# Patient Record
Sex: Female | Born: 1975
Health system: Southern US, Community
[De-identification: ages and names within clinical notes are randomized; demographics above are authoritative.]

## PROBLEM LIST (undated history)

## (undated) DIAGNOSIS — A630 Anogenital (venereal) warts: Secondary | ICD-10-CM

## (undated) DIAGNOSIS — R12 Heartburn: Secondary | ICD-10-CM

## (undated) HISTORY — DX: Anogenital (venereal) warts: A63.0

## (undated) HISTORY — PX: INTRAUTERINE DEVICE (IUD) INSERTION: SHX5877

## (undated) HISTORY — DX: Heartburn: R12

---

## 2005-03-13 DIAGNOSIS — A630 Anogenital (venereal) warts: Secondary | ICD-10-CM

## 2005-03-13 HISTORY — DX: Anogenital (venereal) warts: A63.0

## 2008-10-10 ENCOUNTER — Emergency Department: Payer: Self-pay | Admitting: Emergency Medicine

## 2009-04-25 ENCOUNTER — Inpatient Hospital Stay: Payer: Self-pay

## 2015-12-22 DIAGNOSIS — N898 Other specified noninflammatory disorders of vagina: Secondary | ICD-10-CM | POA: Diagnosis not present

## 2015-12-28 DIAGNOSIS — Z0000 Encounter for general adult medical examination without abnormal findings: Secondary | ICD-10-CM | POA: Diagnosis not present

## 2015-12-28 DIAGNOSIS — Z01419 Encounter for gynecological examination (general) (routine) without abnormal findings: Secondary | ICD-10-CM | POA: Diagnosis not present

## 2015-12-28 DIAGNOSIS — N898 Other specified noninflammatory disorders of vagina: Secondary | ICD-10-CM | POA: Diagnosis not present

## 2016-03-15 DIAGNOSIS — H5213 Myopia, bilateral: Secondary | ICD-10-CM | POA: Diagnosis not present

## 2016-03-15 DIAGNOSIS — H52223 Regular astigmatism, bilateral: Secondary | ICD-10-CM | POA: Diagnosis not present

## 2016-10-04 DIAGNOSIS — J069 Acute upper respiratory infection, unspecified: Secondary | ICD-10-CM | POA: Diagnosis not present

## 2017-05-07 DIAGNOSIS — H5213 Myopia, bilateral: Secondary | ICD-10-CM | POA: Diagnosis not present

## 2017-05-07 DIAGNOSIS — H52223 Regular astigmatism, bilateral: Secondary | ICD-10-CM | POA: Diagnosis not present

## 2017-05-28 ENCOUNTER — Encounter: Payer: Self-pay | Admitting: Obstetrics and Gynecology

## 2017-05-28 ENCOUNTER — Ambulatory Visit (INDEPENDENT_AMBULATORY_CARE_PROVIDER_SITE_OTHER): Payer: 59 | Admitting: Obstetrics and Gynecology

## 2017-05-28 VITALS — BP 102/66 | HR 83 | Ht 66.0 in | Wt 181.0 lb

## 2017-05-28 DIAGNOSIS — Z13220 Encounter for screening for lipoid disorders: Secondary | ICD-10-CM

## 2017-05-28 DIAGNOSIS — Z131 Encounter for screening for diabetes mellitus: Secondary | ICD-10-CM

## 2017-05-28 DIAGNOSIS — Z1151 Encounter for screening for human papillomavirus (HPV): Secondary | ICD-10-CM

## 2017-05-28 DIAGNOSIS — Z1321 Encounter for screening for nutritional disorder: Secondary | ICD-10-CM

## 2017-05-28 DIAGNOSIS — Z30431 Encounter for routine checking of intrauterine contraceptive device: Secondary | ICD-10-CM | POA: Diagnosis not present

## 2017-05-28 DIAGNOSIS — Z124 Encounter for screening for malignant neoplasm of cervix: Secondary | ICD-10-CM | POA: Diagnosis not present

## 2017-05-28 DIAGNOSIS — Z0000 Encounter for general adult medical examination without abnormal findings: Secondary | ICD-10-CM | POA: Diagnosis not present

## 2017-05-28 DIAGNOSIS — Z01419 Encounter for gynecological examination (general) (routine) without abnormal findings: Secondary | ICD-10-CM

## 2017-05-28 NOTE — Patient Instructions (Signed)
I value your feedback and entrusting us with your care. If you get a [facility] patient survey, I would appreciate you taking the time to let us know about your experience today. Thank you! 

## 2017-05-28 NOTE — Addendum Note (Signed)
Addended by: Althea GrimmerOPLAND, Holmes Hays B on: 05/28/2017 03:24 PM   Modules accepted: Orders

## 2017-05-28 NOTE — Progress Notes (Signed)
PCP:  Patient, No Pcp Per   Chief Complaint  Patient presents with  . Gynecologic Exam    HPI:      Carla Johnson is a 42 y.o. No obstetric history on file. who LMP was No LMP recorded. Patient is not currently having periods (Reason: IUD)., presents today for her annual examination.  Her menses are absent with IUD, although she has about 2-3 days light bleeding twice a yr. Dysmenorrhea mild, occurring throughout cycle. She does not have intermenstrual bleeding.  Sex activity: single partner, contraception - IUD. Mirena placed 06/12/14 Last Pap: Jul 15, 2013  Results were: no abnormalities /neg HPV DNA  Hx of STDs: HPV  There is no FH of breast cancer. There is no FH of ovarian cancer. The patient does do self-breast exams.  Tobacco use: The patient denies current or previous tobacco use. Alcohol use: social drinker No drug use.  Exercise: moderately active  She does get adequate calcium but not Vitamin D in her diet.   Past Medical History:  Diagnosis Date  . Genital warts     Past Surgical History:  Procedure Laterality Date  . INTRAUTERINE DEVICE (IUD) INSERTION      Family History  Problem Relation Age of Onset  . Kidney cancer Maternal Grandfather [id number]    Social History   Socioeconomic History  . Marital status: Married    Spouse name: Not on file  . Number of children: Not on file  . Years of education: Not on file  . Highest education level: Not on file  Social Needs  . Financial resource strain: Not on file  . Food insecurity - worry: Not on file  . Food insecurity - inability: Not on file  . Transportation needs - medical: Not on file  . Transportation needs - non-medical: Not on file  Occupational History  . Not on file  Tobacco Use  . Smoking status: Never Smoker  . Smokeless tobacco: Never Used  Substance and Sexual Activity  . Alcohol use: Yes  . Drug use: No  . Sexual activity: Yes    Birth control/protection: IUD  Other Topics Concern    . Not on file  Social History Narrative  . Not on file    Outpatient Medications Prior to Visit  Medication Sig Dispense Refill  . levonorgestrel (MIRENA, 52 MG,) 20 MCG/24HR IUD by Intrauterine route.      No facility-administered medications prior to visit.       ROS:  Review of Systems  Constitutional: Negative for fatigue, fever and unexpected weight change.  Respiratory: Negative for cough, shortness of breath and wheezing.   Cardiovascular: Negative for chest pain, palpitations and leg swelling.  Gastrointestinal: Negative for blood in stool, constipation, diarrhea, nausea and vomiting.  Endocrine: Negative for cold intolerance, heat intolerance and polyuria.  Genitourinary: Negative for dyspareunia, dysuria, flank pain, frequency, genital sores, hematuria, menstrual problem, pelvic pain, urgency, vaginal bleeding, vaginal discharge and vaginal pain.  Musculoskeletal: Negative for back pain, joint swelling and myalgias.  Skin: Negative for rash.  Neurological: Negative for dizziness, syncope, light-headedness, numbness and headaches.  Hematological: Negative for adenopathy.  Psychiatric/Behavioral: Negative for agitation, confusion, sleep disturbance and suicidal ideas. The patient is not nervous/anxious.   BREAST: No symptoms   Objective: BP 102/66   Pulse 83   Ht 5\' 6"  (1.676 m)   Wt 181 lb (82.1 kg)   BMI 29.21 kg/m    Physical Exam  Constitutional: She is oriented to  person, place, and time. She appears well-developed and well-nourished.  Genitourinary: Vagina normal and uterus normal. There is no rash or tenderness on the right labia. There is no rash or tenderness on the left labia. No erythema or tenderness in the vagina. No vaginal discharge found. Right adnexum does not display mass and does not display tenderness. Left adnexum does not display mass and does not display tenderness.  Cervix exhibits visible IUD strings. Cervix does not exhibit motion  tenderness or polyp. Uterus is not enlarged or tender.  Neck: Normal range of motion. No thyromegaly present.  Cardiovascular: Normal rate, regular rhythm and normal heart sounds.  No murmur heard. Pulmonary/Chest: Effort normal and breath sounds normal. Right breast exhibits no mass, no nipple discharge, no skin change and no tenderness. Left breast exhibits no mass, no nipple discharge, no skin change and no tenderness.  Abdominal: Soft. There is no tenderness. There is no guarding.  Musculoskeletal: Normal range of motion.  Neurological: She is alert and oriented to person, place, and time. No cranial nerve deficit.  Psychiatric: She has a normal mood and affect. Her behavior is normal.  Vitals reviewed.   Assessment/Plan: Encounter for annual routine gynecological examination  Cervical cancer screening - Plan: IGP, Aptima HPV  Screening for HPV (human papillomavirus) - Plan: IGP, Aptima HPV  Encounter for routine checking of intrauterine contraceptive device (IUD) - IUD in place. Due for rem 4/21.  Blood tests for routine general physical examination - Plan: Comprehensive metabolic panel, Lipid panel, Hemoglobin A1c, VITAMIN D 25 Hydroxy (Vit-D Deficiency, Fractures)  Screening cholesterol level - Plan: Lipid panel  Screening for diabetes mellitus - Plan: Hemoglobin A1c  Encounter for vitamin deficiency screening - Plan: VITAMIN D 25 Hydroxy (Vit-D Deficiency, Fractures)      GYN counsel adequate intake of calcium and vitamin D, diet and exercise     F/U  Return in about 1 year (around 05/29/2018).  Doshie Maggi B. Lanett Lasorsa, PA-C 05/28/2017 9:02 AM

## 2017-05-30 LAB — IGP, APTIMA HPV
HPV APTIMA: NEGATIVE
PAP Smear Comment: 0

## 2017-05-31 ENCOUNTER — Other Ambulatory Visit: Payer: 59

## 2017-05-31 DIAGNOSIS — Z1321 Encounter for screening for nutritional disorder: Secondary | ICD-10-CM | POA: Diagnosis not present

## 2017-05-31 DIAGNOSIS — Z131 Encounter for screening for diabetes mellitus: Secondary | ICD-10-CM | POA: Diagnosis not present

## 2017-05-31 DIAGNOSIS — Z0000 Encounter for general adult medical examination without abnormal findings: Secondary | ICD-10-CM

## 2017-05-31 DIAGNOSIS — Z13220 Encounter for screening for lipoid disorders: Secondary | ICD-10-CM

## 2017-06-01 LAB — COMPREHENSIVE METABOLIC PANEL
ALBUMIN: 4.5 g/dL (ref 3.5–5.5)
ALK PHOS: 56 IU/L (ref 39–117)
ALT: 17 IU/L (ref 0–32)
AST: 14 IU/L (ref 0–40)
Albumin/Globulin Ratio: 2.3 — ABNORMAL HIGH (ref 1.2–2.2)
BILIRUBIN TOTAL: 0.4 mg/dL (ref 0.0–1.2)
BUN / CREAT RATIO: 15 (ref 9–23)
BUN: 9 mg/dL (ref 6–20)
CO2: 21 mmol/L (ref 20–29)
CREATININE: 0.62 mg/dL (ref 0.57–1.00)
Calcium: 8.7 mg/dL (ref 8.7–10.2)
Chloride: 107 mmol/L — ABNORMAL HIGH (ref 96–106)
GFR calc non Af Amer: 114 mL/min/{1.73_m2} (ref >59)
GFR, EST AFRICAN AMERICAN: 131 mL/min/{1.73_m2} (ref >59)
GLOBULIN, TOTAL: 2 g/dL (ref 1.5–4.5)
Glucose: 80 mg/dL (ref 65–99)
Potassium: 4.1 mmol/L (ref 3.5–5.2)
SODIUM: 142 mmol/L (ref 134–144)
Total Protein: 6.5 g/dL (ref 6.0–8.5)

## 2017-06-01 LAB — HEMOGLOBIN A1C
Est. average glucose Bld gHb Est-mCnc: 91 mg/dL
Hgb A1c MFr Bld: 4.8 % (ref 4.8–5.6)

## 2017-06-01 LAB — LIPID PANEL
CHOLESTEROL TOTAL: 121 mg/dL (ref 100–199)
Chol/HDL Ratio: 3.1 ratio (ref 0.0–4.4)
HDL: 39 mg/dL — AB (ref >39)
LDL Calculated: 65 mg/dL (ref 0–99)
Triglycerides: 86 mg/dL (ref 0–149)
VLDL CHOLESTEROL CAL: 17 mg/dL (ref 5–40)

## 2017-06-01 LAB — VITAMIN D 25 HYDROXY (VIT D DEFICIENCY, FRACTURES): VIT D 25 HYDROXY: 20.3 ng/mL — AB (ref 30.0–100.0)

## 2017-07-10 DIAGNOSIS — H16102 Unspecified superficial keratitis, left eye: Secondary | ICD-10-CM | POA: Diagnosis not present

## 2017-07-10 DIAGNOSIS — H16002 Unspecified corneal ulcer, left eye: Secondary | ICD-10-CM | POA: Diagnosis not present

## 2017-07-13 DIAGNOSIS — H16042 Marginal corneal ulcer, left eye: Secondary | ICD-10-CM | POA: Diagnosis not present

## 2018-05-15 DIAGNOSIS — H52223 Regular astigmatism, bilateral: Secondary | ICD-10-CM | POA: Diagnosis not present

## 2018-05-15 DIAGNOSIS — H5213 Myopia, bilateral: Secondary | ICD-10-CM | POA: Diagnosis not present

## 2018-12-31 DIAGNOSIS — H16141 Punctate keratitis, right eye: Secondary | ICD-10-CM | POA: Diagnosis not present

## 2019-10-22 DIAGNOSIS — H52223 Regular astigmatism, bilateral: Secondary | ICD-10-CM | POA: Diagnosis not present

## 2019-10-22 DIAGNOSIS — H5213 Myopia, bilateral: Secondary | ICD-10-CM | POA: Diagnosis not present

## 2020-02-06 DIAGNOSIS — B9689 Other specified bacterial agents as the cause of diseases classified elsewhere: Secondary | ICD-10-CM | POA: Diagnosis not present

## 2020-02-06 DIAGNOSIS — J209 Acute bronchitis, unspecified: Secondary | ICD-10-CM | POA: Diagnosis not present

## 2020-02-06 DIAGNOSIS — J019 Acute sinusitis, unspecified: Secondary | ICD-10-CM | POA: Diagnosis not present

## 2020-02-06 DIAGNOSIS — U071 COVID-19: Secondary | ICD-10-CM | POA: Diagnosis not present

## 2020-08-12 NOTE — Progress Notes (Signed)
PCP:  Patient, No Pcp Per (Inactive)   Chief Complaint  Patient presents with  . Gynecologic Exam    No concerns    HPI:      Ms. Carla Johnson is a 45 y.o. No obstetric history on file. who LMP was No LMP recorded. (Menstrual status: IUD)., presents today for her NP> 3 yrs annual examination.  Her menses are absent with IUD, although she has about 2-3 days light bleeding once a yr. Dysmenorrhea none. She does not have intermenstrual bleeding.  Sex activity: single partner, contraception - IUD. Mirena placed 06/12/14. May want another one next yr. Last Pap: 05/28/17  Results were: no abnormalities /neg HPV DNA  Hx of STDs: HPV  Mammogram: never There is no FH of breast cancer. There is no FH of ovarian cancer. The patient does do self-breast exams. Has LT breast tenderness at times that hurts to palpate. Unsure if related to cycle since amenorrheic. Drinks lots of caffeine. Concerned about chest pain due to FH CVD/MI in her dad.   Tobacco use: The patient denies current or previous tobacco use. Alcohol use: social drinker No drug use.  Exercise: not active  She does get adequate calcium and some Vitamin D in her diet. Hx of Vit D deficiency.  Due for fasting labs. FH CVD/MI in her dad age 49  Pt with long hx of anxiety, sometimes depression. Did lexapro for a couple yrs in her 25s but has been able to cope/due relaxation strategies since. Is having more anxiety/worry recently. Has increased job and life stress. Thinks she needs to restart meds.    Past Medical History:  Diagnosis Date  . Genital warts     Past Surgical History:  Procedure Laterality Date  . INTRAUTERINE DEVICE (IUD) INSERTION      Family History  Problem Relation Age of Onset  . Kidney cancer Maternal Grandfather 31  . Crohn's disease Father   . Heart attack Father 28  . Coronary artery disease Father 76    Social History   Socioeconomic History  . Marital status: Married    Spouse name: Not on  file  . Number of children: Not on file  . Years of education: Not on file  . Highest education level: Not on file  Occupational History  . Not on file  Tobacco Use  . Smoking status: Never Smoker  . Smokeless tobacco: Never Used  Vaping Use  . Vaping Use: Never used  Substance and Sexual Activity  . Alcohol use: Yes  . Drug use: No  . Sexual activity: Yes    Birth control/protection: I.U.D.    Comment: Mirena  Other Topics Concern  . Not on file  Social History Narrative  . Not on file   Social Determinants of Health   Financial Resource Strain: Not on file  Food Insecurity: Not on file  Transportation Needs: Not on file  Physical Activity: Not on file  Stress: Not on file  Social Connections: Not on file  Intimate Partner Violence: Not on file    Outpatient Medications Prior to Visit  Medication Sig Dispense Refill  . levonorgestrel (MIRENA) 20 MCG/24HR IUD by Intrauterine route.      No facility-administered medications prior to visit.      ROS:  Review of Systems  Constitutional: Negative for fatigue, fever and unexpected weight change.  Respiratory: Negative for cough, shortness of breath and wheezing.   Cardiovascular: Positive for chest pain. Negative for palpitations and leg swelling.  Gastrointestinal: Negative for blood in stool, constipation, diarrhea, nausea and vomiting.  Endocrine: Negative for cold intolerance, heat intolerance and polyuria.  Genitourinary: Negative for dyspareunia, dysuria, flank pain, frequency, genital sores, hematuria, menstrual problem, pelvic pain, urgency, vaginal bleeding, vaginal discharge and vaginal pain.  Musculoskeletal: Negative for back pain, joint swelling and myalgias.  Skin: Negative for rash.  Neurological: Negative for dizziness, syncope, light-headedness, numbness and headaches.  Hematological: Negative for adenopathy.  Psychiatric/Behavioral: Positive for agitation. Negative for confusion, sleep disturbance  and suicidal ideas. The patient is not nervous/anxious.   BREAST: No symptoms   Objective: BP 110/80   Ht 5\' 6"  (1.676 m)   Wt 191 lb (86.6 kg)   BMI 30.83 kg/m    Physical Exam Constitutional:      General: She is not in acute distress.    Appearance: She is well-developed.  Genitourinary:     Vulva normal.     Right Labia: No rash, tenderness or lesions.    Left Labia: No tenderness, lesions or rash.    No vaginal discharge, erythema, tenderness or bleeding.      Right Adnexa: not tender and no mass present.    Left Adnexa: not tender and no mass present.    No cervical motion tenderness, friability or polyp.     IUD strings visualized.     Uterus is not enlarged or tender.  Breasts:     Right: No mass, nipple discharge, skin change or tenderness.     Left: No mass, nipple discharge, skin change or tenderness.    Neck:     Thyroid: No thyromegaly.  Cardiovascular:     Rate and Rhythm: Normal rate and regular rhythm.     Heart sounds: Normal heart sounds. No murmur heard.   Pulmonary:     Effort: Pulmonary effort is normal.     Breath sounds: Normal breath sounds.  Abdominal:     Palpations: Abdomen is soft.     Tenderness: There is no abdominal tenderness. There is no guarding or rebound.  Musculoskeletal:        General: Normal range of motion.     Cervical back: Normal range of motion.  Lymphadenopathy:     Cervical: No cervical adenopathy.  Neurological:     General: No focal deficit present.     Mental Status: She is alert and oriented to person, place, and time.     Cranial Nerves: No cranial nerve deficit.  Skin:    General: Skin is warm and dry.  Psychiatric:        Mood and Affect: Mood normal.        Behavior: Behavior normal.        Thought Content: Thought content normal.        Judgment: Judgment normal.  Vitals and nursing note reviewed.    GAD 7 : Generalized Anxiety Score 08/13/2020  Nervous, Anxious, on Edge 2  Control/stop worrying 2   Worry too much - different things 2  Trouble relaxing 2  Restless 1  Easily annoyed or irritable 3  Afraid - awful might happen 1  Total GAD 7 Score 13  Anxiety Difficulty Very difficult    Depression screen PHQ 2/9 08/13/2020  Decreased Interest 1  Down, Depressed, Hopeless 1  PHQ - 2 Score 2  Altered sleeping 1  Tired, decreased energy 1  Change in appetite 3  Feeling bad or failure about yourself  2  Trouble concentrating 2  Moving slowly or fidgety/restless 1  Suicidal thoughts 0  PHQ-9 Score 12  Difficult doing work/chores Very difficult    Assessment/Plan: Encounter for annual routine gynecological examination  Encounter for routine checking of intrauterine contraceptive device (IUD)--IUD strings in cx os. Due for removal 4/23. Pt to decide if wants replacement.  Encounter for screening mammogram for malignant neoplasm of breast - Plan: MM 3D SCREEN BREAST BILATERAL; pt to sched mammo  Anxiety and depression - Plan: escitalopram (LEXAPRO) 10 MG tablet; Discussed SSRIs and benefits. Rx lexapro. F/u in 7 wks/sooner prn. Cont stress mgmt/coping.  Blood tests for routine general physical examination - Plan: Comprehensive metabolic panel, Lipid panel, VITAMIN D 25 Hydroxy (Vit-D Deficiency, Fractures)  Screening cholesterol level - Plan: Lipid panel  Encounter for vitamin deficiency screening - Plan: VITAMIN D 25 Hydroxy (Vit-D Deficiency, Fractures)  Vitamin D deficiency - Plan: VITAMIN D 25 Hydroxy (Vit-D Deficiency, Fractures)  Meds ordered this encounter  Medications  . escitalopram (LEXAPRO) 10 MG tablet    Sig: Take 1/2 tab daily for 6 days then 1 tab daily    Dispense:  30 tablet    Refill:  1    Order Specific Question:   Supervising Provider    Answer:   Nadara Mustard [[id number]]        GYN counsel adequate intake of calcium and vitamin D, diet and exercise     F/U  Return in about 7 weeks (around 10/01/2020) for anxiety f/u./ 1 yr annual  Chelbie Jarnagin B.  Oleva Koo, PA-C 08/13/2020 9:51 AM

## 2020-08-12 NOTE — Patient Instructions (Addendum)
I value your feedback and you entrusting us with your care. If you get a [facility] patient survey, I would appreciate you taking the time to let us know about your experience today. Thank you!  [facility]: [phone]      

## 2020-08-13 ENCOUNTER — Ambulatory Visit (INDEPENDENT_AMBULATORY_CARE_PROVIDER_SITE_OTHER): Payer: 59 | Admitting: Obstetrics and Gynecology

## 2020-08-13 ENCOUNTER — Encounter: Payer: Self-pay | Admitting: Obstetrics and Gynecology

## 2020-08-13 ENCOUNTER — Other Ambulatory Visit: Payer: Self-pay

## 2020-08-13 VITALS — BP 110/80 | Ht 66.0 in | Wt 191.0 lb

## 2020-08-13 DIAGNOSIS — Z1321 Encounter for screening for nutritional disorder: Secondary | ICD-10-CM | POA: Diagnosis not present

## 2020-08-13 DIAGNOSIS — Z30431 Encounter for routine checking of intrauterine contraceptive device: Secondary | ICD-10-CM

## 2020-08-13 DIAGNOSIS — F419 Anxiety disorder, unspecified: Secondary | ICD-10-CM | POA: Diagnosis not present

## 2020-08-13 DIAGNOSIS — E559 Vitamin D deficiency, unspecified: Secondary | ICD-10-CM | POA: Insufficient documentation

## 2020-08-13 DIAGNOSIS — Z0000 Encounter for general adult medical examination without abnormal findings: Secondary | ICD-10-CM

## 2020-08-13 DIAGNOSIS — Z1231 Encounter for screening mammogram for malignant neoplasm of breast: Secondary | ICD-10-CM | POA: Diagnosis not present

## 2020-08-13 DIAGNOSIS — Z01419 Encounter for gynecological examination (general) (routine) without abnormal findings: Secondary | ICD-10-CM | POA: Diagnosis not present

## 2020-08-13 DIAGNOSIS — Z13220 Encounter for screening for lipoid disorders: Secondary | ICD-10-CM

## 2020-08-13 DIAGNOSIS — F32A Depression, unspecified: Secondary | ICD-10-CM

## 2020-08-13 MED ORDER — ESCITALOPRAM OXALATE 10 MG PO TABS: ORAL_TABLET | ORAL | 1 refills | Status: DC

## 2020-08-14 LAB — LIPID PANEL
Chol/HDL Ratio: 3.2 ratio (ref 0.0–4.4)
Cholesterol, Total: 181 mg/dL (ref 100–199)
HDL: 56 mg/dL (ref >39)
LDL Chol Calc (NIH): 101 mg/dL — ABNORMAL HIGH (ref 0–99)
Triglycerides: 140 mg/dL (ref 0–149)
VLDL Cholesterol Cal: 24 mg/dL (ref 5–40)

## 2020-08-14 LAB — COMPREHENSIVE METABOLIC PANEL
ALT: 49 IU/L — ABNORMAL HIGH (ref 0–32)
AST: 24 IU/L (ref 0–40)
Albumin/Globulin Ratio: 2 (ref 1.2–2.2)
Albumin: 4.8 g/dL (ref 3.8–4.8)
Alkaline Phosphatase: 66 IU/L (ref 44–121)
BUN/Creatinine Ratio: 15 (ref 9–23)
BUN: 10 mg/dL (ref 6–24)
Bilirubin Total: 0.4 mg/dL (ref 0.0–1.2)
CO2: 21 mmol/L (ref 20–29)
Calcium: 9.6 mg/dL (ref 8.7–10.2)
Chloride: 103 mmol/L (ref 96–106)
Creatinine, Ser: 0.66 mg/dL (ref 0.57–1.00)
Globulin, Total: 2.4 g/dL (ref 1.5–4.5)
Glucose: 91 mg/dL (ref 65–99)
Potassium: 4.3 mmol/L (ref 3.5–5.2)
Sodium: 140 mmol/L (ref 134–144)
Total Protein: 7.2 g/dL (ref 6.0–8.5)
eGFR: 112 mL/min/{1.73_m2} (ref >59)

## 2020-08-14 LAB — VITAMIN D 25 HYDROXY (VIT D DEFICIENCY, FRACTURES): Vit D, 25-Hydroxy: 39 ng/mL (ref 30.0–100.0)

## 2020-10-01 NOTE — Progress Notes (Signed)
Patient, No Pcp Per (Inactive)   Chief Complaint  Patient presents with   Follow-up    Anxiety f/u    HPI:      Ms. Carla Johnson is a 45 y.o. G2P1011 whose LMP was No LMP recorded. (Menstrual status: IUD)., presents today for anxiety f/u from 6/22 annual. Restarted lexapro 10 mg, doing well. No side effects. No change in job stress but handling it better. Would like to cont meds. PHQ and GAD scores much improved.   08/13/20 Pt with long hx of anxiety, sometimes depression. Did lexapro for a couple yrs in her 42s but has been able to cope/due relaxation strategies since. Is having more anxiety/worry recently. Has increased job and life stress. Thinks she needs to restart meds.   Past Medical History:  Diagnosis Date   Genital warts     Past Surgical History:  Procedure Laterality Date   INTRAUTERINE DEVICE (IUD) INSERTION      Family History  Problem Relation Age of Onset   Kidney cancer Maternal Grandfather 67   Crohn's disease Father    Heart attack Father 65   Coronary artery disease Father 84    Social History   Socioeconomic History   Marital status: Married    Spouse name: Not on file   Number of children: Not on file   Years of education: Not on file   Highest education level: Not on file  Occupational History   Not on file  Tobacco Use   Smoking status: Never   Smokeless tobacco: Never  Vaping Use   Vaping Use: Never used  Substance and Sexual Activity   Alcohol use: Yes   Drug use: No   Sexual activity: Yes    Birth control/protection: I.U.D.    Comment: Mirena  Other Topics Concern   Not on file  Social History Narrative   Not on file   Social Determinants of Health   Financial Resource Strain: Not on file  Food Insecurity: Not on file  Transportation Needs: Not on file  Physical Activity: Not on file  Stress: Not on file  Social Connections: Not on file  Intimate Partner Violence: Not on file    Outpatient Medications Prior to Visit   Medication Sig Dispense Refill   levonorgestrel (MIRENA) 20 MCG/24HR IUD by Intrauterine route.      escitalopram (LEXAPRO) 10 MG tablet Take 1/2 tab daily for 6 days then 1 tab daily 30 tablet 1   No facility-administered medications prior to visit.      ROS:  Review of Systems  Constitutional:  Negative for fever.  Gastrointestinal:  Negative for blood in stool, constipation, diarrhea, nausea and vomiting.  Genitourinary:  Negative for dyspareunia, dysuria, flank pain, frequency, hematuria, urgency, vaginal bleeding, vaginal discharge and vaginal pain.  Musculoskeletal:  Negative for back pain.  Skin:  Negative for rash.  Psychiatric/Behavioral:  Negative for agitation and dysphoric mood.   BREAST: No symptoms   OBJECTIVE:   Vitals:  BP 130/80   Ht 5\' 6"  (1.676 m)   Wt 190 lb (86.2 kg)   BMI 30.67 kg/m   Physical Exam Constitutional:      Appearance: Normal appearance.  Pulmonary:     Effort: Pulmonary effort is normal.  Musculoskeletal:        General: Normal range of motion.  Neurological:     Mental Status: She is alert and oriented to person, place, and time.  Psychiatric:  Judgment: Judgment normal.    Results: GAD 7 : Generalized Anxiety Score 10/04/2020 08/13/2020  Nervous, Anxious, on Edge 0 2  Control/stop worrying 0 2  Worry too much - different things 0 2  Trouble relaxing 0 2  Restless 0 1  Easily annoyed or irritable 1 3  Afraid - awful might happen 0 1  Total GAD 7 Score 1 13  Anxiety Difficulty Not difficult at all Very difficult    Depression screen [facility] 2/9 10/04/2020  Decreased Interest 0  Down, Depressed, Hopeless 1  PHQ - 2 Score 1  Altered sleeping 1  Tired, decreased energy 0  Change in appetite 0  Feeling bad or failure about yourself  0  Trouble concentrating 0  Moving slowly or fidgety/restless 0  Suicidal thoughts 0  PHQ-9 Score 2  Difficult doing work/chores Somewhat difficult      Assessment/Plan: Anxiety and  depression - Plan: escitalopram (LEXAPRO) 10 MG tablet; doing well. Rx RF. F/u prn   Meds ordered this encounter  Medications   escitalopram (LEXAPRO) 10 MG tablet    Sig: Take 1 tablet (10 mg total) by mouth daily.    Dispense:  90 tablet    Refill:  3    Order Specific Question:   Supervising Provider    Answer:   Nadara Mustard [[id number]]       Return if symptoms worsen or fail to improve.  Aylan Bayona B. Karry Causer, PA-C 10/04/2020 9:28 AM

## 2020-10-04 ENCOUNTER — Other Ambulatory Visit: Payer: Self-pay

## 2020-10-04 ENCOUNTER — Encounter: Payer: Self-pay | Admitting: Obstetrics and Gynecology

## 2020-10-04 ENCOUNTER — Ambulatory Visit (INDEPENDENT_AMBULATORY_CARE_PROVIDER_SITE_OTHER): Payer: 59 | Admitting: Obstetrics and Gynecology

## 2020-10-04 VITALS — BP 130/80 | Ht 66.0 in | Wt 190.0 lb

## 2020-10-04 DIAGNOSIS — F32A Depression, unspecified: Secondary | ICD-10-CM | POA: Diagnosis not present

## 2020-10-04 DIAGNOSIS — F419 Anxiety disorder, unspecified: Secondary | ICD-10-CM | POA: Diagnosis not present

## 2020-10-04 MED ORDER — ESCITALOPRAM OXALATE 10 MG PO TABS
10.0000 mg | ORAL_TABLET | Freq: Every day | ORAL | 3 refills | Status: DC
  Filled 2020-10-04: qty 90, 90d supply, fill #0
  Filled 2021-02-21: qty 90, 90d supply, fill #1
  Filled 2021-07-14: qty 90, 90d supply, fill #2
  Filled 2021-10-01: qty 90, 90d supply, fill #3

## 2020-12-16 DIAGNOSIS — H5213 Myopia, bilateral: Secondary | ICD-10-CM | POA: Diagnosis not present

## 2020-12-17 DIAGNOSIS — H348122 Central retinal vein occlusion, left eye, stable: Secondary | ICD-10-CM | POA: Diagnosis not present

## 2020-12-24 ENCOUNTER — Other Ambulatory Visit: Payer: Self-pay

## 2020-12-24 DIAGNOSIS — R0789 Other chest pain: Secondary | ICD-10-CM | POA: Diagnosis not present

## 2020-12-24 DIAGNOSIS — Z130 Encounter for screening for diseases of the blood and blood-forming organs and certain disorders involving the immune mechanism: Secondary | ICD-10-CM | POA: Diagnosis not present

## 2020-12-24 DIAGNOSIS — F419 Anxiety disorder, unspecified: Secondary | ICD-10-CM | POA: Diagnosis not present

## 2020-12-24 DIAGNOSIS — H3509 Other intraretinal microvascular abnormalities: Secondary | ICD-10-CM | POA: Diagnosis not present

## 2020-12-24 MED ORDER — PROPRANOLOL HCL 10 MG PO TABS
ORAL_TABLET | ORAL | 2 refills | Status: AC
  Filled 2020-12-24: qty 30, 30d supply, fill #0

## 2020-12-29 ENCOUNTER — Ambulatory Visit
Admission: RE | Admit: 2020-12-29 | Discharge: 2020-12-29 | Disposition: A | Discharge status: Home / Self Care | Payer: 59 | Source: Ambulatory Visit | Attending: Obstetrics and Gynecology | Admitting: Obstetrics and Gynecology

## 2020-12-29 ENCOUNTER — Other Ambulatory Visit: Payer: Self-pay

## 2020-12-29 DIAGNOSIS — Z1231 Encounter for screening mammogram for malignant neoplasm of breast: Secondary | ICD-10-CM | POA: Insufficient documentation

## 2020-12-31 DIAGNOSIS — Z131 Encounter for screening for diabetes mellitus: Secondary | ICD-10-CM | POA: Diagnosis not present

## 2020-12-31 DIAGNOSIS — Z13220 Encounter for screening for lipoid disorders: Secondary | ICD-10-CM | POA: Diagnosis not present

## 2021-01-11 DIAGNOSIS — H348122 Central retinal vein occlusion, left eye, stable: Secondary | ICD-10-CM

## 2021-01-11 HISTORY — DX: Central retinal vein occlusion, left eye, stable: H34.8122

## 2021-01-13 ENCOUNTER — Inpatient Hospital Stay: Payer: 59

## 2021-01-13 ENCOUNTER — Inpatient Hospital Stay: Payer: 59 | Attending: Oncology | Admitting: Oncology

## 2021-01-13 ENCOUNTER — Other Ambulatory Visit: Payer: Self-pay

## 2021-01-13 ENCOUNTER — Encounter: Payer: Self-pay | Admitting: Oncology

## 2021-01-13 VITALS — BP 119/84 | HR 70 | Temp 98.1°F | Resp 18 | Wt 195.9 lb

## 2021-01-13 DIAGNOSIS — Z79899 Other long term (current) drug therapy: Secondary | ICD-10-CM | POA: Insufficient documentation

## 2021-01-13 DIAGNOSIS — H348122 Central retinal vein occlusion, left eye, stable: Secondary | ICD-10-CM | POA: Insufficient documentation

## 2021-01-13 DIAGNOSIS — Z8051 Family history of malignant neoplasm of kidney: Secondary | ICD-10-CM | POA: Diagnosis not present

## 2021-01-13 DIAGNOSIS — Z793 Long term (current) use of hormonal contraceptives: Secondary | ICD-10-CM | POA: Insufficient documentation

## 2021-01-13 DIAGNOSIS — Z8249 Family history of ischemic heart disease and other diseases of the circulatory system: Secondary | ICD-10-CM | POA: Diagnosis not present

## 2021-01-13 DIAGNOSIS — Z8379 Family history of other diseases of the digestive system: Secondary | ICD-10-CM | POA: Insufficient documentation

## 2021-01-13 LAB — ANTITHROMBIN III: AntiThromb III Func: 106 % (ref 75–120)

## 2021-01-13 NOTE — Progress Notes (Signed)
Hematology/Oncology Consult note [facility] [phone] [phone]   Patient Care Team: Cyndia Diver, PA-C as PCP - General (Family Medicine)  REFERRING PROVIDER: Cyndia Diver, PA-C  CHIEF COMPLAINTS/REASON FOR VISIT:  Evaluation of left retinal vein occlusion  HISTORY OF PRESENTING ILLNESS:   Carla Johnson is a  45 y.o.  female with PMH listed below was seen in consultation at the request of  Cyndia Diver, PA-C  for evaluation of left eye retinal vein occlusion Patient was recently referred by her optometrist to a retina specialist due to crowding behind left eye.  Patient denies any significant vision changes or concerns.  After being told that patient has retinal vein occlusion, patient has then noticed some " thickness" sensation.  Patient was recommended by her ophthalmology specialist to have hypercoagulable work-up. With her primary care provider patient has had blood work done which showed a normal A1c and normal cholesterol level. Patient is otherwise healthy.  Patient is on Lexapro She also is on Mirena IUD. Patient denies any personal history of blood clots or family history of blood clots. Family history positive for maternal grandfather with kidney cancer.  No other cancer in the family. She denies any unintentional weight loss, night sweats, fever.  Review of Systems  Constitutional:  Negative for appetite change, chills, fatigue and fever.  HENT:   Negative for hearing loss and voice change.   Eyes:  Positive for eye problems.  Respiratory:  Negative for chest tightness and cough.   Cardiovascular:  Negative for chest pain.  Gastrointestinal:  Negative for abdominal distention, abdominal pain and blood in stool.  Endocrine: Negative for hot flashes.  Genitourinary:  Negative for difficulty urinating and frequency.   Musculoskeletal:  Negative for arthralgias.  Skin:  Negative for itching and rash.   Neurological:  Negative for extremity weakness.  Hematological:  Negative for adenopathy.  Psychiatric/Behavioral:  Negative for confusion.    MEDICAL HISTORY:  Past Medical History:  Diagnosis Date   Genital warts 2007   Heartburn     SURGICAL HISTORY: Past Surgical History:  Procedure Laterality Date   INTRAUTERINE DEVICE (IUD) INSERTION      SOCIAL HISTORY: Social History   Socioeconomic History   Marital status: Married    Spouse name: Not on file   Number of children: Not on file   Years of education: Not on file   Highest education level: Not on file  Occupational History   Not on file  Tobacco Use   Smoking status: Former    Packs/day: 1.00    Years: 15.00    Pack years: 15.00    Types: Cigarettes    Quit date: 2010    Years since quitting: 12.8   Smokeless tobacco: Never  Vaping Use   Vaping Use: Never used  Substance and Sexual Activity   Alcohol use: Yes    Comment: a couple times a week   Drug use: No   Sexual activity: Yes    Birth control/protection: I.U.D.    Comment: Mirena  Other Topics Concern   Not on file  Social History Narrative   Not on file   Social Determinants of Health   Financial Resource Strain: Not on file  Food Insecurity: Not on file  Transportation Needs: Not on file  Physical Activity: Not on file  Stress: Not on file  Social Connections: Not on file  Intimate Partner Violence: Not on file    FAMILY HISTORY: Family History  Problem Relation  Age of Onset   Crohn's disease Father    Heart attack Father 43   Coronary artery disease Father 41   Kidney cancer Maternal Grandfather 67    ALLERGIES:  has No Known Allergies.  MEDICATIONS:  Current Outpatient Medications  Medication Sig Dispense Refill   escitalopram (LEXAPRO) 10 MG tablet Take 1 tablet (10 mg total) by mouth daily. 90 tablet 3   levonorgestrel (MIRENA) 20 MCG/24HR IUD by Intrauterine route.      propranolol (INDERAL) 10 MG tablet Take 1 tablet (10  mg total) by mouth once daily as needed (anxiety) 30 tablet 2   No current facility-administered medications for this visit.     PHYSICAL EXAMINATION: ECOG PERFORMANCE STATUS: 0 - Asymptomatic Vitals:   01/13/21 1105  BP: 119/84  Pulse: 70  Resp: 18  Temp: 98.1 F (36.7 C)   Filed Weights   01/13/21 1105  Weight: 195 lb 14.4 oz (88.9 kg)    Physical Exam Constitutional:      General: She is not in acute distress.    Appearance: She is obese.  HENT:     Head: Normocephalic and atraumatic.  Eyes:     General: No scleral icterus. Cardiovascular:     Rate and Rhythm: Normal rate and regular rhythm.     Heart sounds: Normal heart sounds.  Pulmonary:     Effort: Pulmonary effort is normal. No respiratory distress.     Breath sounds: No wheezing.  Abdominal:     General: Bowel sounds are normal. There is no distension.     Palpations: Abdomen is soft.  Musculoskeletal:        General: No deformity. Normal range of motion.     Cervical back: Normal range of motion and neck supple.  Skin:    General: Skin is warm and dry.     Findings: No erythema or rash.  Neurological:     Mental Status: She is alert and oriented to person, place, and time. Mental status is at baseline.     Cranial Nerves: No cranial nerve deficit.     Coordination: Coordination normal.  Psychiatric:        Mood and Affect: Mood normal.    LABORATORY DATA:  I have reviewed the data as listed No results found for: WBC, HGB, HCT, MCV, PLT Recent Labs    08/13/20 1001  NA 140  K 4.3  CL 103  CO2 21  GLUCOSE 91  BUN 10  CREATININE 0.66  CALCIUM 9.6  PROT 7.2  ALBUMIN 4.8  AST 24  ALT 49*  ALKPHOS 66  BILITOT 0.4   Iron/TIBC/Ferritin/ %Sat No results found for: IRON, TIBC, FERRITIN, IRONPCTSAT    RADIOGRAPHIC STUDIES: I have personally reviewed the radiological images as listed and agreed with the findings in the report. MM 3D SCREEN BREAST BILATERAL  Result Date:  01/03/2021 CLINICAL DATA:  Screening. EXAM: DIGITAL SCREENING BILATERAL MAMMOGRAM WITH TOMOSYNTHESIS AND CAD TECHNIQUE: Bilateral screening digital craniocaudal and mediolateral oblique mammograms were obtained. Bilateral screening digital breast tomosynthesis was performed. The images were evaluated with computer-aided detection. COMPARISON:  None. ACR Breast Density Category c: The breast tissue is heterogeneously dense, which may obscure small masses FINDINGS: There are no findings suspicious for malignancy. IMPRESSION: No mammographic evidence of malignancy. A result letter of this screening mammogram will be mailed directly to the patient. RECOMMENDATION: Screening mammogram in one year. (Code:SM-B-01Y) BI-RADS CATEGORY  1: Negative. Electronically Signed   By: Cathe Mons.D.  On: 01/03/2021 13:44     ASSESSMENT & PLAN:  1. Retinal vein occlusion of left eye, unspecified retinal vein    Will obtain hypercoagulable work-up.  Prothrombin gene mutation, factor V Leiden mutation, Antithrombin III, protein S total and free, protein C activity, antiphospholipid panel, PNH.  Lifestyle modification was recommended and discussed with patient.  Orders Placed This Encounter  Procedures   ANTIPHOSPHOLIPID SYNDROME PROF    Standing Status:   Future    Number of Occurrences:   1    Standing Expiration Date:   01/13/2022   Prothrombin gene mutation    Standing Status:   Future    Number of Occurrences:   1    Standing Expiration Date:   01/13/2022   PNH Profile (-High Sensitivity)    Standing Status:   Future    Number of Occurrences:   1    Standing Expiration Date:   01/13/2022   Factor 5 leiden    Standing Status:   Future    Number of Occurrences:   1    Standing Expiration Date:   01/13/2022   Antithrombin III    Standing Status:   Future    Number of Occurrences:   1    Standing Expiration Date:   01/13/2022   Protein S, total and free    Standing Status:   Future    Number of  Occurrences:   1    Standing Expiration Date:   01/13/2022   Protein C activity    Standing Status:   Future    Number of Occurrences:   1    Standing Expiration Date:   01/13/2022    All questions were answered. The patient knows to call the clinic with any problems questions or concerns.   Dionicia Abler, PA-C    Return of visit: A few weeks.  Virtual visit. Thank you for this kind referral and the opportunity to participate in the care of this patient. A copy of today's note is routed to referring provider    Earlie Server, MD, PhD Hematology Oncology [place] [facility]  01/13/2021

## 2021-01-13 NOTE — Progress Notes (Signed)
Pt here to establish care. Pt reports she has occasional left chest discomfort.

## 2021-01-14 LAB — PROTEIN C ACTIVITY: Protein C Activity: 137 % (ref 73–180)

## 2021-01-14 LAB — PROTEIN S, TOTAL AND FREE
Protein S Ag, Free: 124 % (ref 61–136)
Protein S Ag, Total: 109 % (ref 60–150)

## 2021-01-15 LAB — ANTIPHOSPHOLIPID SYNDROME PROF
Anticardiolipin IgG: <9 GPL U/mL (ref 0–14)
Anticardiolipin IgM: 16 MPL U/mL — ABNORMAL HIGH (ref 0–12)
DRVVT: 33.2 s (ref 0.0–47.0)
PTT Lupus Anticoagulant: 31.5 s (ref 0.0–51.9)

## 2021-01-18 LAB — PNH PROFILE (-HIGH SENSITIVITY)

## 2021-01-19 LAB — PROTHROMBIN GENE MUTATION

## 2021-01-19 LAB — FACTOR 5 LEIDEN

## 2021-02-10 ENCOUNTER — Inpatient Hospital Stay: Payer: 59 | Attending: Oncology | Admitting: Oncology

## 2021-02-10 ENCOUNTER — Encounter: Payer: Self-pay | Admitting: Oncology

## 2021-02-10 ENCOUNTER — Other Ambulatory Visit: Payer: Self-pay

## 2021-02-10 DIAGNOSIS — H348122 Central retinal vein occlusion, left eye, stable: Secondary | ICD-10-CM | POA: Diagnosis not present

## 2021-02-10 NOTE — Progress Notes (Signed)
HEMATOLOGY-ONCOLOGY TeleHEALTH VISIT PROGRESS NOTE  I connected with Carla Johnson on 02/10/21  at  2:30 PM EST by video enabled telemedicine visit and verified that I am speaking with the correct person using two identifiers. I discussed the limitations, risks, security and privacy concerns of performing an evaluation and management service by telemedicine and the availability of in-person appointments. The patient expressed understanding and agreed to proceed.   Other persons participating in the visit and their role in the encounter:  None  Patient's location: Home  Provider's location: office Chief Complaint: Follow-up to discuss lab results.  Retinal vein occlusion   INTERVAL HISTORY Carla Johnson is a 45 y.o. female who has above history reviewed by me today presents for follow up visit for retinal vein occlusion, discuss lab results. She continues to have left eye discomfort/fullness.  She plans to call ophthalmology office.  Review of Systems  Constitutional:  Negative for appetite change, chills, fatigue and fever.  HENT:   Negative for hearing loss and voice change.   Eyes:  Positive for eye problems.  Respiratory:  Negative for chest tightness and cough.   Cardiovascular:  Negative for chest pain.  Gastrointestinal:  Negative for abdominal distention, abdominal pain and blood in stool.  Endocrine: Negative for hot flashes.  Genitourinary:  Negative for difficulty urinating and frequency.   Musculoskeletal:  Negative for arthralgias.  Skin:  Negative for itching and rash.  Neurological:  Negative for extremity weakness.  Hematological:  Negative for adenopathy.  Psychiatric/Behavioral:  Negative for confusion.    Past Medical History:  Diagnosis Date   Genital warts 2007   Heartburn    Past Surgical History:  Procedure Laterality Date   INTRAUTERINE DEVICE (IUD) INSERTION      Family History  Problem Relation Age of Onset   Crohn's disease Father    Heart attack  Father 19   Coronary artery disease Father 71   Kidney cancer Maternal Grandfather 58    Social History   Socioeconomic History   Marital status: Married    Spouse name: Not on file   Number of children: Not on file   Years of education: Not on file   Highest education level: Not on file  Occupational History   Not on file  Tobacco Use   Smoking status: Former    Packs/day: 1.00    Years: 15.00    Pack years: 15.00    Types: Cigarettes    Quit date: 2010    Years since quitting: 12.9   Smokeless tobacco: Never  Vaping Use   Vaping Use: Never used  Substance and Sexual Activity   Alcohol use: Yes    Comment: a couple times a week   Drug use: No   Sexual activity: Yes    Birth control/protection: I.U.D.    Comment: Mirena  Other Topics Concern   Not on file  Social History Narrative   Not on file   Social Determinants of Health   Financial Resource Strain: Not on file  Food Insecurity: Not on file  Transportation Needs: Not on file  Physical Activity: Not on file  Stress: Not on file  Social Connections: Not on file  Intimate Partner Violence: Not on file    Current Outpatient Medications on File Prior to Visit  Medication Sig Dispense Refill   escitalopram (LEXAPRO) 10 MG tablet Take 1 tablet (10 mg total) by mouth daily. 90 tablet 3   levonorgestrel (MIRENA) 20 MCG/24HR IUD by Intrauterine route.  propranolol (INDERAL) 10 MG tablet Take 1 tablet (10 mg total) by mouth once daily as needed (anxiety) 30 tablet 2   No current facility-administered medications on file prior to visit.    No Known Allergies     Observations/Objective: Today's Vitals   02/10/21 1426  PainSc: 2    There is no height or weight on file to calculate BMI.  Physical Exam Neurological:     Mental Status: She is alert.    CBC No results found for: WBC, RBC, HGB, HCT, PLT, MCV, MCH, MCHC, RDW, LYMPHSABS, MONOABS, EOSABS, BASOSABS  CMP     Component Value Date/Time   NA  140 08/13/2020 1001   K 4.3 08/13/2020 1001   CL 103 08/13/2020 1001   CO2 21 08/13/2020 1001   GLUCOSE 91 08/13/2020 1001   BUN 10 08/13/2020 1001   CREATININE 0.66 08/13/2020 1001   CALCIUM 9.6 08/13/2020 1001   PROT 7.2 08/13/2020 1001   ALBUMIN 4.8 08/13/2020 1001   AST 24 08/13/2020 1001   ALT 49 (H) 08/13/2020 1001   ALKPHOS 66 08/13/2020 1001   BILITOT 0.4 08/13/2020 1001   GFRNONAA 114 05/31/2017 0821   GFRAA 131 05/31/2017 0821     Assessment and Plan: 1. Retinal vein occlusion of left eye, unspecified retinal vein     Labs reviewed and discussed with patient. Antiphospholipid panel negative, except indeterminate anticardiolipin IgM mildly increased to 16.  This is nonspecific.  Not meeting diagnostic criteria, normal protein C activity, normal protein S antigen.  Normal Antithrombin III, negative factor Leiden 5 mutation, negative PNH profile.  Negative prothrombin gene mutation. Recommend continue follow-up ophthalmology. She also has had normal A1c and lipid panel.  Patient does not need to continue follow-up with hematology at this point.  I am happy to see her again for additional work-up if  additional concern or new symptoms.  I discussed the assessment and treatment plan with the patient. The patient was provided an opportunity to ask questions and all were answered. The patient agreed with the plan and demonstrated an understanding of the instructions.  The patient was advised to call back or seek an in-person evaluation if the symptoms worsen or if the condition fails to improve as anticipated.    Earlie Server, MD 02/10/2021 10:30 PM

## 2021-02-21 ENCOUNTER — Other Ambulatory Visit: Payer: Self-pay

## 2021-04-15 DIAGNOSIS — H348122 Central retinal vein occlusion, left eye, stable: Secondary | ICD-10-CM | POA: Diagnosis not present

## 2021-04-19 ENCOUNTER — Encounter: Payer: Self-pay | Admitting: Obstetrics and Gynecology

## 2021-05-06 ENCOUNTER — Other Ambulatory Visit: Payer: Self-pay

## 2021-05-06 DIAGNOSIS — U071 COVID-19: Secondary | ICD-10-CM | POA: Diagnosis not present

## 2021-05-06 MED ORDER — FLUTICASONE PROPIONATE 50 MCG/ACT NA SUSP
NASAL | 1 refills | Status: AC
  Filled 2021-05-06: qty 16, 30d supply, fill #0

## 2021-05-06 MED ORDER — BENZONATATE 200 MG PO CAPS
ORAL_CAPSULE | ORAL | 0 refills | Status: DC
  Filled 2021-05-06: qty 20, 7d supply, fill #0

## 2021-05-06 MED ORDER — PREDNISONE 10 MG PO TABS
ORAL_TABLET | ORAL | 0 refills | Status: DC
  Filled 2021-05-06: qty 10, 10d supply, fill #0

## 2021-07-14 ENCOUNTER — Other Ambulatory Visit: Payer: Self-pay

## 2021-10-03 ENCOUNTER — Other Ambulatory Visit: Payer: Self-pay

## 2021-10-26 DIAGNOSIS — H348122 Central retinal vein occlusion, left eye, stable: Secondary | ICD-10-CM | POA: Diagnosis not present

## 2021-10-26 DIAGNOSIS — H04123 Dry eye syndrome of bilateral lacrimal glands: Secondary | ICD-10-CM | POA: Diagnosis not present

## 2021-11-14 IMAGING — MG MM DIGITAL SCREENING BILAT W/ TOMO AND CAD
8 series · 8 of 24 positions shown · non-contrast
Comparison: None.

CLINICAL DATA: Screening.

EXAM:
DIGITAL SCREENING BILATERAL MAMMOGRAM WITH TOMOSYNTHESIS AND CAD
TECHNIQUE: Bilateral screening digital craniocaudal and mediolateral oblique
mammograms were obtained. Bilateral screening digital breast
tomosynthesis was performed. The images were evaluated with
computer-aided detection.

[L MLO synth-2D]
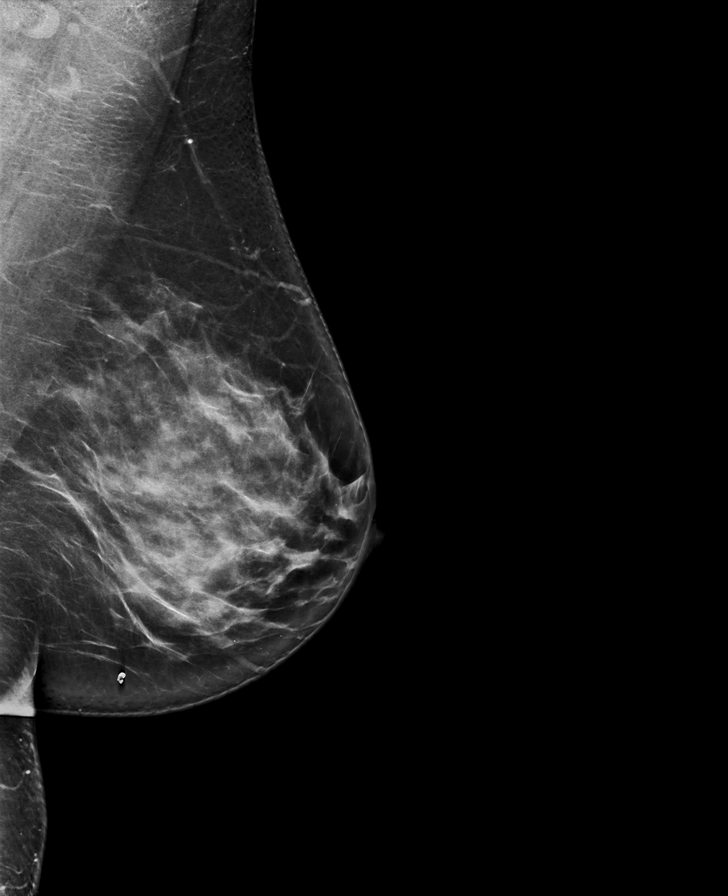

[R MLO synth-2D]
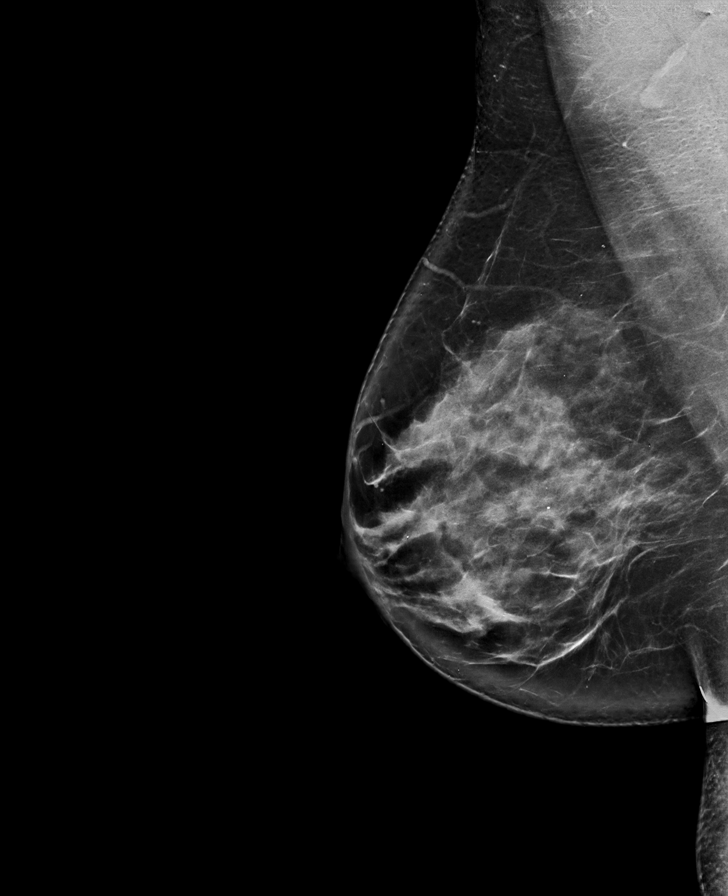

[L CC synth-2D]
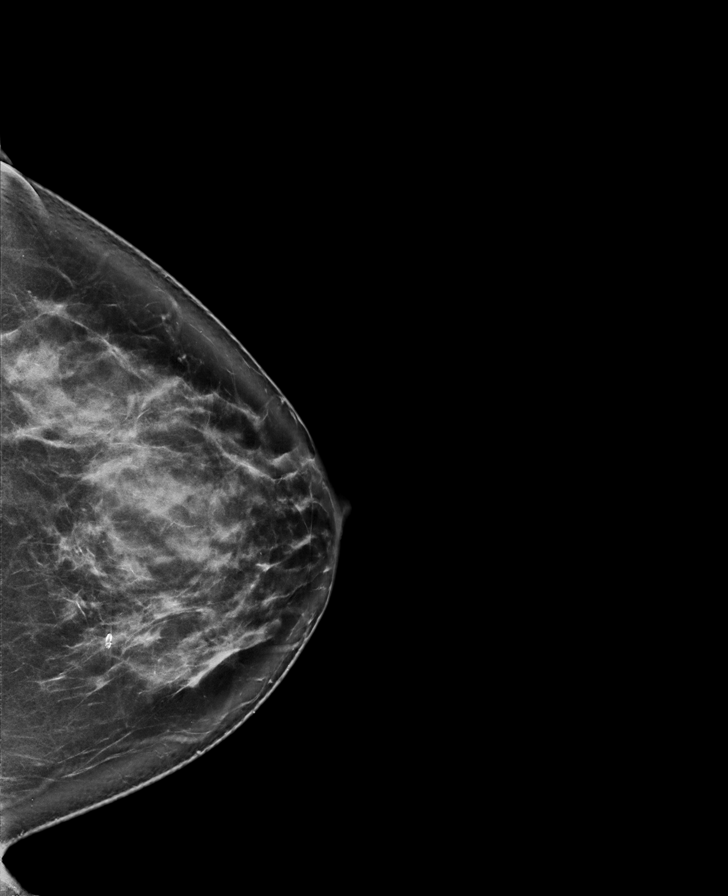

[R CC synth-2D]
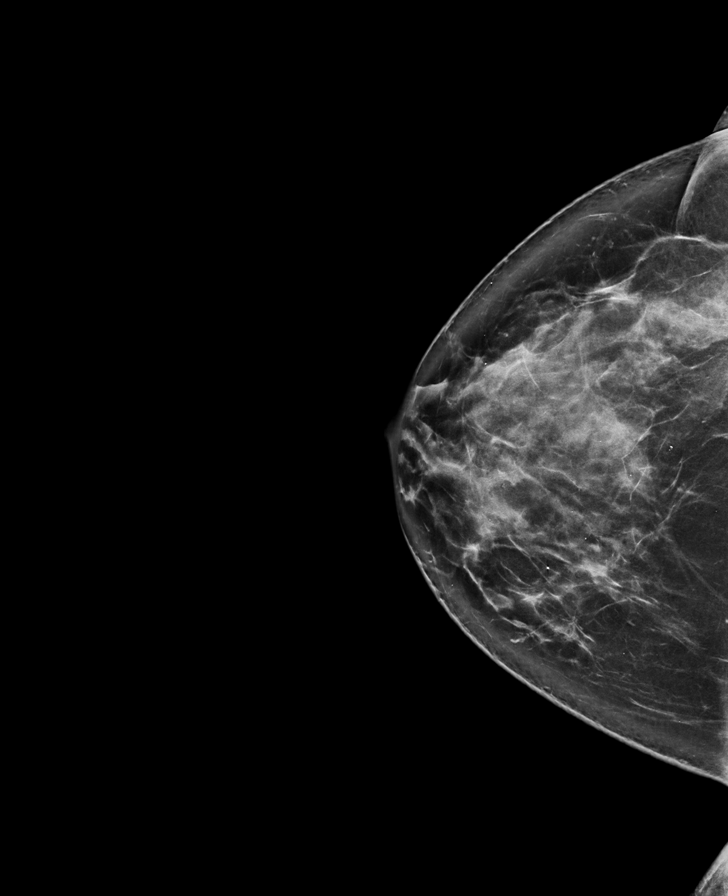

[L CC tomo · tomo slice 47/94.0]
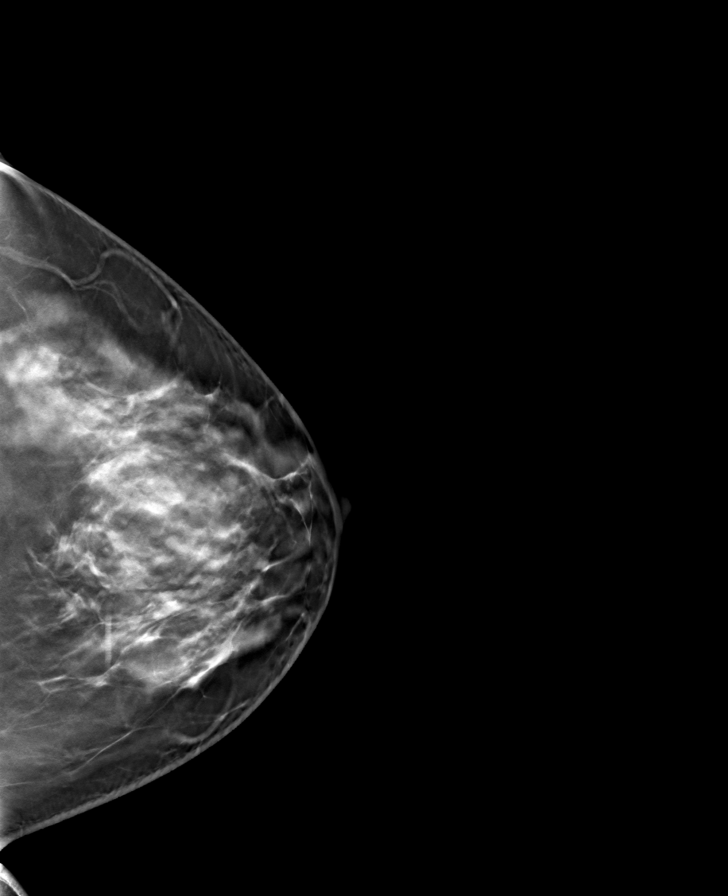

[R MLO tomo · tomo slice 50/99.0]
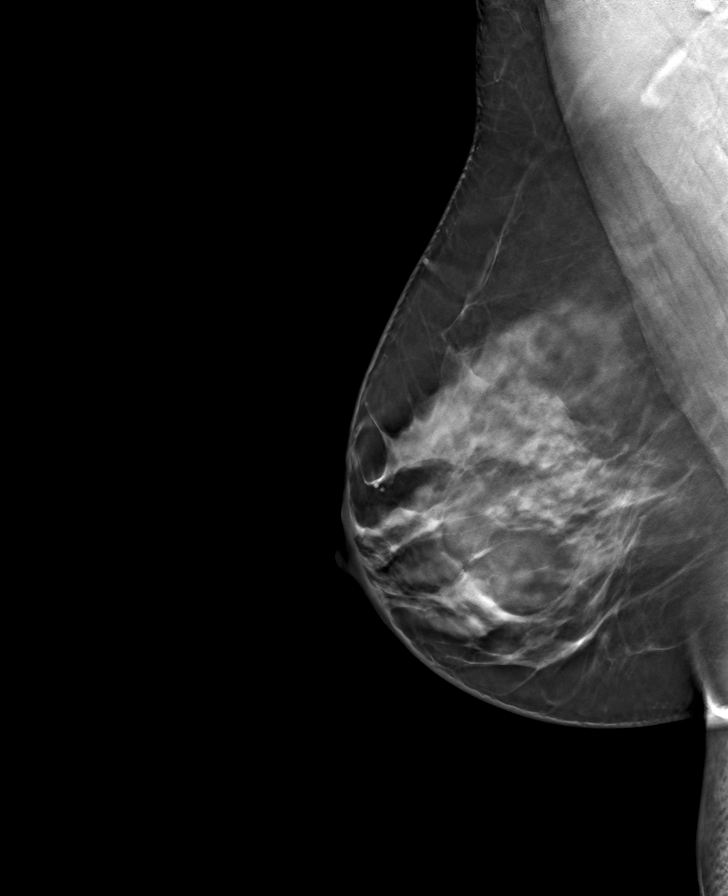

[R CC tomo · tomo slice 45/89.0]
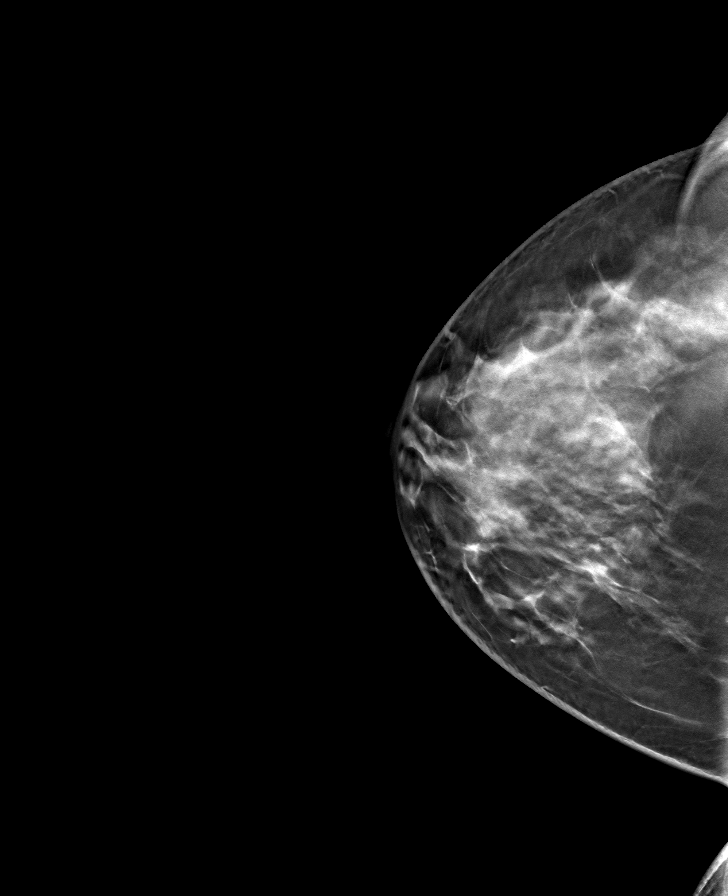

[L MLO tomo · tomo slice 47/93.0]
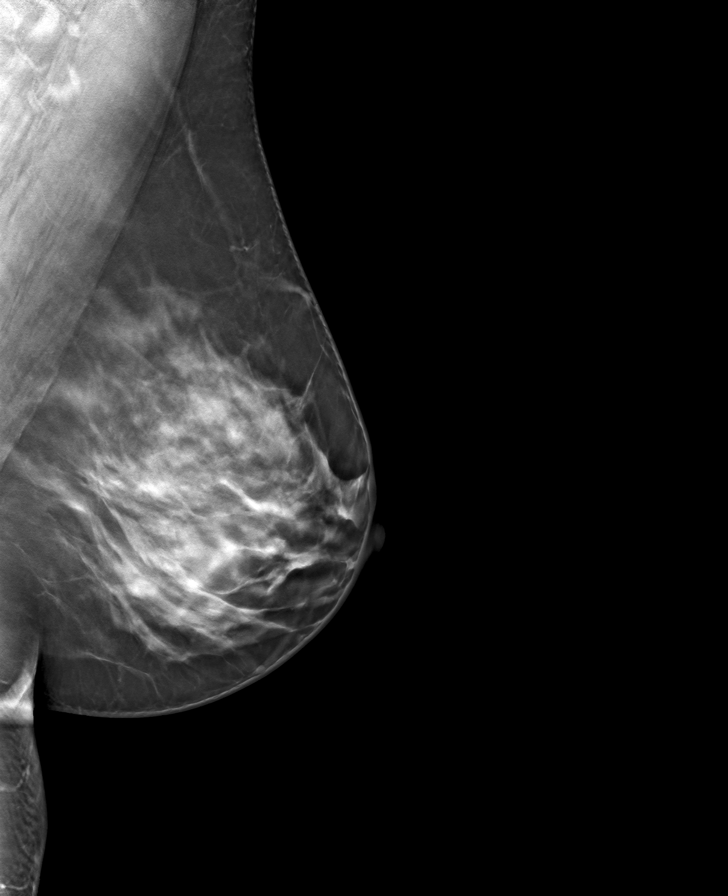

[8 of 24 positions shown; findings below may reference images not displayed]

ACR Breast Density Category c: The breast tissue is heterogeneously
dense, which may obscure small masses
FINDINGS: There are no findings suspicious for malignancy.
IMPRESSION: No mammographic evidence of malignancy. A result letter of this
screening mammogram will be mailed directly to the patient.

RECOMMENDATION:
Screening mammogram in one year. (Code:C8-T-HNK)

BI-RADS CATEGORY  1: Negative.

## 2021-11-16 NOTE — Progress Notes (Signed)
PCP:  Cyndia Diver, PA-C   Chief Complaint  Patient presents with   Gynecologic Exam    Mirena replacement?    HPI:      Ms. Carla Johnson is a 46 y.o. No obstetric history on file. who LMP was No LMP recorded. (Menstrual status: IUD)., presents today for her annual examination.  Her menses are absent with Mirena IUD, although she has about 2-3 days light bleeding once a yr. Dysmenorrhea none. She does not have intermenstrual bleeding.  Sex activity: single partner, contraception - IUD. Mirena placed 06/12/14. Has been diagnosed this yr with retinal vein inclusion. MD thinks could be related to Mirena since all other clotting labs neg, and recommends pt have it removed. Pt doesn't want a period (hx of heavy days in past). Last Pap: 05/28/17  Results were: no abnormalities /neg HPV DNA  Hx of STDs: HPV  Mammogram: 12/29/20  Results were normal, repeat in 12 months There is no FH of breast cancer. There is no FH of ovarian cancer. The patient does do self-breast exams.   Tobacco use: The patient denies current or previous tobacco use. Alcohol use: social drinker No drug use.  Exercise: not active  She does get adequate calcium but not Vitamin D in her diet. Hx of Vit D deficiency.  Normal labs with PCP 10/22. FH CVD/MI in her dad age 71  Pt with long hx of anxiety, sometimes depression. Did lexapro for a couple yrs in her 25s and restarted on lexapro 10 mg last yr. Pt doesn't feel like sx controlled adequately.    Past Medical History:  Diagnosis Date   Genital warts 2007   Heartburn    Retinal vein occlusion of left eye 01/2021    Past Surgical History:  Procedure Laterality Date   INTRAUTERINE DEVICE (IUD) INSERTION      Family History  Problem Relation Age of Onset   Crohn's disease Father    Heart attack Father 74   Coronary artery disease Father 71   Kidney cancer Maternal Grandfather 84    Social History   Socioeconomic History   Marital status: Married     Spouse name: Not on file   Number of children: Not on file   Years of education: Not on file   Highest education level: Not on file  Occupational History   Not on file  Tobacco Use   Smoking status: Former    Packs/day: 1.00    Years: 15.00    Total pack years: 15.00    Types: Cigarettes    Quit date: 2010    Years since quitting: 13.6   Smokeless tobacco: Never  Vaping Use   Vaping Use: Never used  Substance and Sexual Activity   Alcohol use: Yes    Comment: a couple times a week   Drug use: No   Sexual activity: Yes    Birth control/protection: I.U.D.    Comment: Mirena  Other Topics Concern   Not on file  Social History Narrative   Not on file   Social Determinants of Health   Financial Resource Strain: Not on file  Food Insecurity: Not on file  Transportation Needs: Not on file  Physical Activity: Inactive (05/28/2017)   Exercise Vital Sign    Days of Exercise per Week: 0 days    Minutes of Exercise per Session: 0 min  Stress: No Stress Concern Present (05/28/2017)   [employer] of Occupational Health - Occupational Stress Questionnaire  Feeling of Stress : Only a little  Social Connections: Not on file  Intimate Partner Violence: Not on file    Outpatient Medications Prior to Visit  Medication Sig Dispense Refill   fluticasone (FLONASE) 50 MCG/ACT nasal spray Place 1 spray into both nostrils 2 (two) times daily 16 g 1   levonorgestrel (MIRENA) 20 MCG/24HR IUD by Intrauterine route.      propranolol (INDERAL) 10 MG tablet Take 1 tablet (10 mg total) by mouth once daily as needed (anxiety) 30 tablet 2   escitalopram (LEXAPRO) 10 MG tablet Take 1 tablet (10 mg total) by mouth daily. 90 tablet 3   benzonatate (TESSALON) 200 MG capsule Take 1 capsule (200 mg total) by mouth 3 (three) times daily as needed for Cough for up to 7 days 20 capsule 0   predniSONE (DELTASONE) 10 MG tablet Take 1 tablet (10 mg total) by mouth once daily for 10 days 10 tablet 0    No facility-administered medications prior to visit.      ROS:  Review of Systems  Constitutional:  Negative for fatigue, fever and unexpected weight change.  Respiratory:  Negative for cough, shortness of breath and wheezing.   Cardiovascular:  Negative for chest pain, palpitations and leg swelling.  Gastrointestinal:  Negative for blood in stool, constipation, diarrhea, nausea and vomiting.  Endocrine: Negative for cold intolerance, heat intolerance and polyuria.  Genitourinary:  Negative for dyspareunia, dysuria, flank pain, frequency, genital sores, hematuria, menstrual problem, pelvic pain, urgency, vaginal bleeding, vaginal discharge and vaginal pain.  Musculoskeletal:  Negative for back pain, joint swelling and myalgias.  Skin:  Negative for rash.  Neurological:  Negative for dizziness, syncope, light-headedness, numbness and headaches.  Hematological:  Negative for adenopathy.  Psychiatric/Behavioral:  Positive for agitation and dysphoric mood. Negative for confusion, sleep disturbance and suicidal ideas. The patient is not nervous/anxious.   BREAST: No symptoms   Objective: BP 104/70   Ht 5\' 6"  (1.676 m)   Wt 204 lb (92.5 kg)   BMI 32.93 kg/m    Physical Exam Constitutional:      General: She is not in acute distress.    Appearance: She is well-developed.  Genitourinary:     Vulva normal.     Right Labia: No rash, tenderness or lesions.    Left Labia: No tenderness, lesions or rash.    No vaginal discharge, erythema, tenderness or bleeding.      Right Adnexa: not tender and no mass present.    Left Adnexa: not tender and no mass present.    No cervical motion tenderness, friability or polyp.     IUD strings visualized.     Uterus is not enlarged or tender.  Breasts:    Right: No mass, nipple discharge, skin change or tenderness.     Left: No mass, nipple discharge, skin change or tenderness.  Neck:     Thyroid: No thyromegaly.  Cardiovascular:     Rate  and Rhythm: Normal rate and regular rhythm.     Heart sounds: Normal heart sounds. No murmur heard. Pulmonary:     Effort: Pulmonary effort is normal.     Breath sounds: Normal breath sounds.  Abdominal:     Palpations: Abdomen is soft.     Tenderness: There is no abdominal tenderness. There is no guarding or rebound.  Musculoskeletal:        General: Normal range of motion.     Cervical back: Normal range of motion.  Lymphadenopathy:  Cervical: No cervical adenopathy.  Neurological:     General: No focal deficit present.     Mental Status: She is alert and oriented to person, place, and time.     Cranial Nerves: No cranial nerve deficit.  Skin:    General: Skin is warm and dry.  Psychiatric:        Mood and Affect: Mood normal.        Behavior: Behavior normal.        Thought Content: Thought content normal.        Judgment: Judgment normal.  Vitals and nursing note reviewed.     Assessment/Plan: Encounter for annual routine gynecological examination  Cervical cancer screening - Plan: Cytology - PAP  Screening for HPV (human papillomavirus) - Plan: Cytology - PAP  Encounter for routine checking of intrauterine contraceptive device (IUD)--IUD strings in cx os. Discussed removal and replacement with Paragard IUD to be hormone free. Pt not interested in removal today and will consider. Pt aware has 8 yr indication now.   Encounter for screening mammogram for malignant neoplasm of breast - Plan: MM 3D SCREEN BREAST BILATERAL; pt to schedule mammo  Anxiety and depression - Plan: escitalopram (LEXAPRO) 20 MG tablet; increase dose to 20 mg. F/u prn.   Retinal vein occlusion--discussed removing Mirena and changing to paragard vs TL/vasectomy. Pt doesn't want periods, declines IUD removal for now. Will consider and f/u   Meds ordered this encounter  Medications   escitalopram (LEXAPRO) 20 MG tablet    Sig: Take 1 tablet (20 mg total) by mouth daily.    Dispense:  90 tablet     Refill:  3    Order Specific Question:   Supervising Provider    Answer:   Waymon Budge        GYN counsel adequate intake of calcium and vitamin D, diet and exercise     F/U  Return in about 1 year (around 11/18/2022)./ 1 yr annual  [place]garia B. Mikayela Deats, PA-C 11/17/2021 5:41 PM

## 2021-11-17 ENCOUNTER — Ambulatory Visit (INDEPENDENT_AMBULATORY_CARE_PROVIDER_SITE_OTHER): Payer: 59 | Admitting: Obstetrics and Gynecology

## 2021-11-17 ENCOUNTER — Other Ambulatory Visit (HOSPITAL_COMMUNITY)
Admission: RE | Admit: 2021-11-17 | Discharge: 2021-11-17 | Disposition: A | Discharge status: Home / Self Care | Payer: 59 | Source: Ambulatory Visit | Attending: Obstetrics and Gynecology | Admitting: Obstetrics and Gynecology

## 2021-11-17 ENCOUNTER — Other Ambulatory Visit: Payer: Self-pay

## 2021-11-17 ENCOUNTER — Encounter: Payer: Self-pay | Admitting: Obstetrics and Gynecology

## 2021-11-17 VITALS — BP 104/70 | Ht 66.0 in | Wt 204.0 lb

## 2021-11-17 DIAGNOSIS — Z1231 Encounter for screening mammogram for malignant neoplasm of breast: Secondary | ICD-10-CM | POA: Diagnosis not present

## 2021-11-17 DIAGNOSIS — F419 Anxiety disorder, unspecified: Secondary | ICD-10-CM | POA: Diagnosis not present

## 2021-11-17 DIAGNOSIS — Z124 Encounter for screening for malignant neoplasm of cervix: Secondary | ICD-10-CM | POA: Diagnosis not present

## 2021-11-17 DIAGNOSIS — Z01419 Encounter for gynecological examination (general) (routine) without abnormal findings: Secondary | ICD-10-CM | POA: Diagnosis not present

## 2021-11-17 DIAGNOSIS — F32A Depression, unspecified: Secondary | ICD-10-CM | POA: Diagnosis not present

## 2021-11-17 DIAGNOSIS — Z30431 Encounter for routine checking of intrauterine contraceptive device: Secondary | ICD-10-CM | POA: Diagnosis not present

## 2021-11-17 DIAGNOSIS — Z1151 Encounter for screening for human papillomavirus (HPV): Secondary | ICD-10-CM | POA: Insufficient documentation

## 2021-11-17 MED ORDER — ESCITALOPRAM OXALATE 20 MG PO TABS
20.0000 mg | ORAL_TABLET | Freq: Every day | ORAL | 3 refills | Status: DC
  Filled 2021-11-17: qty 90, 90d supply, fill #0
  Filled 2022-04-20: qty 90, 90d supply, fill #1
  Filled 2022-09-10: qty 90, 90d supply, fill #2

## 2021-11-17 NOTE — Patient Instructions (Signed)
I value your feedback and you entrusting us with your care. If you get a [facility] patient survey, I would appreciate you taking the time to let us know about your experience today. Thank you! ? ? ?

## 2021-11-18 ENCOUNTER — Other Ambulatory Visit: Payer: Self-pay

## 2021-11-23 LAB — CYTOLOGY - PAP
Comment: NEGATIVE
Diagnosis: NEGATIVE
High risk HPV: NEGATIVE

## 2021-11-28 DIAGNOSIS — H6981 Other specified disorders of Eustachian tube, right ear: Secondary | ICD-10-CM | POA: Diagnosis not present

## 2021-12-30 ENCOUNTER — Ambulatory Visit
Admission: RE | Admit: 2021-12-30 | Discharge: 2021-12-30 | Disposition: A | Discharge status: Home / Self Care | Payer: 59 | Source: Ambulatory Visit | Attending: Obstetrics and Gynecology | Admitting: Obstetrics and Gynecology

## 2021-12-30 DIAGNOSIS — Z1231 Encounter for screening mammogram for malignant neoplasm of breast: Secondary | ICD-10-CM

## 2022-01-31 DIAGNOSIS — H5213 Myopia, bilateral: Secondary | ICD-10-CM | POA: Diagnosis not present

## 2022-01-31 DIAGNOSIS — H52223 Regular astigmatism, bilateral: Secondary | ICD-10-CM | POA: Diagnosis not present

## 2022-05-09 ENCOUNTER — Other Ambulatory Visit: Payer: Self-pay

## 2022-05-09 DIAGNOSIS — J0100 Acute maxillary sinusitis, unspecified: Secondary | ICD-10-CM | POA: Diagnosis not present

## 2022-05-09 DIAGNOSIS — L298 Other pruritus: Secondary | ICD-10-CM | POA: Diagnosis not present

## 2022-05-09 MED ORDER — PREDNISONE 10 MG PO TABS
ORAL_TABLET | ORAL | 0 refills | Status: DC
  Filled 2022-05-09: qty 15, 10d supply, fill #0

## 2022-05-09 MED ORDER — TRIAMCINOLONE ACETONIDE 0.1 % EX OINT
1.0000 | TOPICAL_OINTMENT | Freq: Two times a day (BID) | CUTANEOUS | 0 refills | Status: DC
  Filled 2022-05-09: qty 30, 30d supply, fill #0

## 2022-09-10 ENCOUNTER — Other Ambulatory Visit: Payer: Self-pay

## 2022-10-29 DIAGNOSIS — L259 Unspecified contact dermatitis, unspecified cause: Secondary | ICD-10-CM | POA: Diagnosis not present

## 2022-11-07 ENCOUNTER — Other Ambulatory Visit: Payer: Self-pay

## 2022-11-07 DIAGNOSIS — Z566 Other physical and mental strain related to work: Secondary | ICD-10-CM | POA: Diagnosis not present

## 2022-11-07 DIAGNOSIS — F419 Anxiety disorder, unspecified: Secondary | ICD-10-CM | POA: Diagnosis not present

## 2022-11-07 MED ORDER — BUSPIRONE HCL 5 MG PO TABS
5.0000 mg | ORAL_TABLET | Freq: Two times a day (BID) | ORAL | 1 refills | Status: DC
  Filled 2022-11-07: qty 60, 30d supply, fill #0
  Filled 2023-02-12: qty 60, 30d supply, fill #1

## 2023-02-12 ENCOUNTER — Other Ambulatory Visit: Payer: Self-pay

## 2023-02-12 ENCOUNTER — Other Ambulatory Visit: Payer: Self-pay | Admitting: Obstetrics and Gynecology

## 2023-02-12 ENCOUNTER — Telehealth: Payer: Self-pay

## 2023-02-12 DIAGNOSIS — F32A Depression, unspecified: Secondary | ICD-10-CM

## 2023-02-12 MED FILL — Escitalopram Oxalate Tab 20 MG (Base Equiv): ORAL | 90 days supply | Qty: 90 | Fill #0 | Status: AC

## 2023-02-12 NOTE — Telephone Encounter (Signed)
Called pt, no answer, unable to leave voice mail due to mail box full.

## 2023-02-12 NOTE — Telephone Encounter (Signed)
Pt called triage requesting RF on lexapro to her [facility] pharmacy. Annual scheduled for 02/19/23.

## 2023-02-12 NOTE — Telephone Encounter (Signed)
Rx RF eRxd.  

## 2023-02-15 NOTE — Progress Notes (Signed)
PCP:  Cyndia Diver, PA-C   Chief Complaint  Patient presents with   Gynecologic Exam    No concerns    HPI:      Ms. Carla Johnson is a 47 y.o. No obstetric history on file. who LMP was No LMP recorded. (Menstrual status: IUD)., presents today for her annual examination.  Her menses are absent with Mirena IUD,  Dysmenorrhea none. She does not have intermenstrual bleeding.   Sex activity: single partner, contraception - IUD. Mirena placed 06/12/14. Diagnosed last yr with retinal vein inclusion. MD thinks could be related to Mirena since all other clotting labs neg, and recommended pt have it removed. Pt doesn't want a period (hx of heavy days in past) so declined last yr. Interested in paragard IUD (discussed last yr) now since Mirena expired and due for removal. Last Pap: 11/17/21  Results were: no abnormalities /neg HPV DNA  Hx of STDs: HPV  Mammogram: 12/30/21  Results were normal, repeat in 12 months There is no FH of breast cancer. There is no FH of ovarian cancer. The patient does do self-breast exams.   Tobacco use: The patient denies current or previous tobacco use. Alcohol use: up to 3-4 drinks daily to a few times weekly No drug use.  Exercise: not active  Colonoscopy: never  She does get adequate calcium but not Vitamin D in her diet. Hx of Vit D deficiency.  Normal labs with PCP 10/22. FH CVD/MI in her dad age 71  Pt with long hx of anxiety, sometimes depression. On lexapro 20 mg daily and buspar 5 mg daily (prescribed by PCP). Doing well with this regimen, wants to continue.   Past Medical History:  Diagnosis Date   Genital warts 2007   Heartburn    Retinal vein occlusion of left eye 01/2021    Past Surgical History:  Procedure Laterality Date   INTRAUTERINE DEVICE (IUD) INSERTION      Family History  Problem Relation Age of Onset   Crohn's disease Father    Heart attack Father 79   Coronary artery disease Father 28   Kidney cancer Maternal  Grandfather 51    Social History   Socioeconomic History   Marital status: Married    Spouse name: Not on file   Number of children: Not on file   Years of education: Not on file   Highest education level: Not on file  Occupational History   Not on file  Tobacco Use   Smoking status: Former    Current packs/day: 0.00    Average packs/day: 1 pack/day for 15.0 years (15.0 ttl pk-yrs)    Types: Cigarettes    Start date: 55    Quit date: 2010    Years since quitting: 14.9   Smokeless tobacco: Never  Vaping Use   Vaping status: Never Used  Substance and Sexual Activity   Alcohol use: Yes    Comment: a couple times a week   Drug use: No   Sexual activity: Yes    Birth control/protection: I.U.D.    Comment: Mirena  Other Topics Concern   Not on file  Social History Narrative   Not on file   Social Determinants of Health   Financial Resource Strain: Not on file  Food Insecurity: Not on file  Transportation Needs: Not on file  Physical Activity: Inactive (05/28/2017)   Exercise Vital Sign    Days of Exercise per Week: 0 days    Minutes of Exercise per Session:  0 min  Stress: No Stress Concern Present (05/28/2017)   [employer] of Occupational Health - Occupational Stress Questionnaire    Feeling of Stress : Only a little  Social Connections: Not on file  Intimate Partner Violence: Not on file    Outpatient Medications Prior to Visit  Medication Sig Dispense Refill   fluticasone (FLONASE) 50 MCG/ACT nasal spray Place 1 spray into both nostrils 2 (two) times daily 16 g 1   PARAGARD INTRAUTERINE COPPER IU 1 each by Intrauterine route once.     propranolol (INDERAL) 10 MG tablet Take 1 tablet (10 mg total) by mouth once daily as needed (anxiety) 30 tablet 2   busPIRone (BUSPAR) 5 MG tablet Take 1 tablet (5 mg total) by mouth 2 (two) times daily 60 tablet 1   escitalopram (LEXAPRO) 20 MG tablet Take 1 tablet (20 mg total) by mouth daily. 90 tablet 0   levonorgestrel  (MIRENA) 20 MCG/24HR IUD by Intrauterine route.      triamcinolone ointment (KENALOG) 0.1 % Apply 1 Application topically 2 (two) times daily. 30 g 0   No facility-administered medications prior to visit.      ROS:  Review of Systems  Constitutional:  Negative for fatigue, fever and unexpected weight change.  Respiratory:  Negative for cough, shortness of breath and wheezing.   Cardiovascular:  Negative for chest pain, palpitations and leg swelling.  Gastrointestinal:  Negative for blood in stool, constipation, diarrhea, nausea and vomiting.  Endocrine: Negative for cold intolerance, heat intolerance and polyuria.  Genitourinary:  Negative for dyspareunia, dysuria, flank pain, frequency, genital sores, hematuria, menstrual problem, pelvic pain, urgency, vaginal bleeding, vaginal discharge and vaginal pain.  Musculoskeletal:  Negative for back pain, joint swelling and myalgias.  Skin:  Negative for rash.  Neurological:  Negative for dizziness, syncope, light-headedness, numbness and headaches.  Hematological:  Negative for adenopathy.  Psychiatric/Behavioral:  Negative for agitation, confusion, dysphoric mood, sleep disturbance and suicidal ideas. The patient is not nervous/anxious.   BREAST: No symptoms   Objective: BP 114/75   Pulse 66   Ht 5\' 6"  (1.676 m)   Wt 202 lb (91.6 kg)   BMI 32.60 kg/m    Physical Exam Constitutional:      General: She is not in acute distress.    Appearance: She is well-developed.  Genitourinary:     Vulva normal.     Right Labia: No rash, tenderness or lesions.    Left Labia: No tenderness, lesions or rash.    No vaginal discharge, erythema, tenderness or bleeding.     No cervical motion tenderness, friability or polyp.     IUD strings visualized.  Breasts:    Right: No mass, nipple discharge, skin change or tenderness.     Left: No mass, nipple discharge, skin change or tenderness.  Neck:     Thyroid: No thyromegaly.  Cardiovascular:      Rate and Rhythm: Normal rate and regular rhythm.     Heart sounds: Normal heart sounds. No murmur heard. Pulmonary:     Effort: Pulmonary effort is normal.     Breath sounds: Normal breath sounds.  Abdominal:     Palpations: Abdomen is soft.     Tenderness: There is no abdominal tenderness. There is no guarding or rebound.  Musculoskeletal:        General: Normal range of motion.     Cervical back: Normal range of motion.  Lymphadenopathy:     Cervical: No cervical adenopathy.  Neurological:  General: No focal deficit present.     Mental Status: She is alert and oriented to person, place, and time.     Cranial Nerves: No cranial nerve deficit.  Skin:    General: Skin is warm and dry.  Psychiatric:        Mood and Affect: Mood normal.        Behavior: Behavior normal.        Thought Content: Thought content normal.        Judgment: Judgment normal.  Vitals and nursing note reviewed.    Results for orders placed or performed in visit on 02/19/23 (from the past 24 hour(s))  POCT urine pregnancy     Status: Normal   Collection Time: 02/19/23  4:28 PM  Result Value Ref Range   Preg Test, Ur Negative Negative   IUD Removal Strings of IUD identified and grasped.  IUD removed without problem with ring forceps.  Pt tolerated this well.  IUD noted to be intact.   IUD Insertion Procedure Note Patient identified, informed consent performed, consent signed.   Discussed risks of irregular bleeding, cramping, infection, malpositioning or misplacement of the IUD outside the uterus which may require further procedure such as laparoscopy, risk of failure <1%. Time out was performed.  Urine pregnancy test negative.  Speculum placed in the vagina.  Cervix visualized.  Cleaned with Betadine x 2.  Grasped anteriorly with a single tooth tenaculum.  Uterus sounded to 8.0 cm.   IUD placed per manufacturer's recommendations.  Strings trimmed to 3 cm. Tenaculum was removed, good hemostasis noted.   Patient tolerated procedure well.   Assessment/Plan: Encounter for annual routine gynecological examination  Encounter for routine checking of intrauterine contraceptive device (IUD)--IUD strings in cx os, expired 4/24; would like Paragard IUD now  Encounter for screening mammogram for malignant neoplasm of breast - Plan: MM 3D SCREENING MAMMOGRAM BILATERAL BREAST; pt to schedule mammo  Anxiety and depression - Plan: busPIRone (BUSPAR) 5 MG tablet, escitalopram (LEXAPRO) 20 MG tablet; Rx RF, doing well. F/u prn.   Screening for colon cancer - Plan: Cologuard; colonoscopy/cologuard discussed. Pt elects cologuard. Ref sent. Will f/u with results.  Blood tests for routine general physical examination - Plan: Comprehensive metabolic panel, Hemoglobin A1c, Lipid panel, CBC with Differential/Platelet  Screening cholesterol level - Plan: Lipid panel  Screening for diabetes mellitus - Plan: Hemoglobin A1c  Encounter for removal and reinsertion of intrauterine contraceptive device-- (IUD)Patient was given post-procedure instructions. No sex for 7 days. Call if you are having increasing pain, cramps or bleeding or if you have a fever greater than 100.4 degrees F., shaking chills, nausea or vomiting. Patient was also asked to follow up in 4 weeks for IUD check.   Meds ordered this encounter  Medications   paragard intrauterine copper IUD 1 each   busPIRone (BUSPAR) 5 MG tablet    Sig: Take 1 tablet (5 mg total) by mouth 2 (two) times daily    Dispense:  60 tablet    Refill:  5    Order Specific Question:   Supervising Provider    Answer:   Hildred Laser [[id number]]   escitalopram (LEXAPRO) 20 MG tablet    Sig: Take 1 tablet (20 mg total) by mouth daily.    Dispense:  90 tablet    Refill:  3    Order Specific Question:   Supervising Provider    Answer:   Waymon Budge        GYN counsel adequate  intake of calcium and vitamin D, diet and exercise     F/U  Return in about 4 weeks  (around 03/19/2023) for IUD f/u/fasting labs.  Savaughn Karwowski B. Kiefer Opheim, PA-C 02/19/2023 4:33 PM

## 2023-02-19 ENCOUNTER — Other Ambulatory Visit: Payer: Self-pay

## 2023-02-19 ENCOUNTER — Encounter: Payer: Self-pay | Admitting: Obstetrics and Gynecology

## 2023-02-19 ENCOUNTER — Ambulatory Visit (INDEPENDENT_AMBULATORY_CARE_PROVIDER_SITE_OTHER): Payer: 59 | Admitting: Obstetrics and Gynecology

## 2023-02-19 VITALS — BP 114/75 | HR 66 | Ht 66.0 in | Wt 202.0 lb

## 2023-02-19 DIAGNOSIS — Z1231 Encounter for screening mammogram for malignant neoplasm of breast: Secondary | ICD-10-CM

## 2023-02-19 DIAGNOSIS — Z3202 Encounter for pregnancy test, result negative: Secondary | ICD-10-CM | POA: Diagnosis not present

## 2023-02-19 DIAGNOSIS — Z30431 Encounter for routine checking of intrauterine contraceptive device: Secondary | ICD-10-CM

## 2023-02-19 DIAGNOSIS — F32A Depression, unspecified: Secondary | ICD-10-CM

## 2023-02-19 DIAGNOSIS — Z01419 Encounter for gynecological examination (general) (routine) without abnormal findings: Secondary | ICD-10-CM | POA: Diagnosis not present

## 2023-02-19 DIAGNOSIS — Z0000 Encounter for general adult medical examination without abnormal findings: Secondary | ICD-10-CM

## 2023-02-19 DIAGNOSIS — Z30433 Encounter for removal and reinsertion of intrauterine contraceptive device: Secondary | ICD-10-CM

## 2023-02-19 DIAGNOSIS — Z1211 Encounter for screening for malignant neoplasm of colon: Secondary | ICD-10-CM

## 2023-02-19 DIAGNOSIS — Z13220 Encounter for screening for lipoid disorders: Secondary | ICD-10-CM

## 2023-02-19 DIAGNOSIS — Z131 Encounter for screening for diabetes mellitus: Secondary | ICD-10-CM

## 2023-02-19 LAB — POCT URINE PREGNANCY: Preg Test, Ur: NEGATIVE

## 2023-02-19 MED ORDER — PARAGARD INTRAUTERINE COPPER IU IUD
1.0000 | INTRAUTERINE_SYSTEM | Freq: Once | INTRAUTERINE | Status: AC
  Administered 2023-02-19: 1 via INTRAUTERINE

## 2023-02-19 MED ORDER — BUSPIRONE HCL 5 MG PO TABS
5.0000 mg | ORAL_TABLET | Freq: Two times a day (BID) | ORAL | 5 refills | Status: AC
  Filled 2023-02-19 – 2023-07-13 (×2): qty 60, 30d supply, fill #0
  Filled 2023-11-06: qty 60, 30d supply, fill #1
  Filled 2024-01-14: qty 60, 30d supply, fill #2

## 2023-02-19 MED ORDER — ESCITALOPRAM OXALATE 20 MG PO TABS
20.0000 mg | ORAL_TABLET | Freq: Every day | ORAL | 3 refills | Status: AC
  Filled 2023-02-19 – 2023-07-13 (×2): qty 90, 90d supply, fill #0
  Filled 2024-01-14: qty 90, 90d supply, fill #1

## 2023-02-19 NOTE — Patient Instructions (Addendum)
I value your feedback and you entrusting [place] with your care. If you get a [facility] patient survey, I would appreciate you taking the time to let us know about your experience today. Thank you!  Instructions after IUD insertion  Most women experience no significant problems after insertion of an IUD, however minor cramping and spotting for a few days is common. Cramps may be treated with ibuprofen 800mg  every 8 hours or Tylenol 650 mg every 4 hours. Contact [facility] immediately if you experience any of the following symptoms during the next week: temperature >99.6 degrees, worsening pelvic pain, abdominal pain, fainting, unusually heavy vaginal bleeding, foul vaginal discharge, or if you think you have expelled the IUD. Nothing inserted in the vagina for 48 hours. You will be scheduled for a follow up visit in approximately four weeks.   [facility] ([place])--[phone]

## 2023-02-26 DIAGNOSIS — H5213 Myopia, bilateral: Secondary | ICD-10-CM | POA: Diagnosis not present

## 2023-03-15 NOTE — Progress Notes (Deleted)
   No chief complaint on file.    History of Present Illness:  Carla Johnson is a 48 y.o. that had a Paragard  IUD placed approximately 1 month ago due to retinal vein occlusion (concern due to prog in Mirena). Since that time, she denies dyspareunia, pelvic pain, non-menstrual bleeding, vaginal d/c, heavy bleeding.   Review of Systems  Physical Exam:  There were no vitals taken for this visit. There is no height or weight on file to calculate BMI.  Pelvic exam:  Two IUD strings {DESC; PRESENT/ABSENT:17923} seen coming from the cervical os. EGBUS, vaginal vault and cervix: within normal limits   Assessment:   No diagnosis found.  IUD strings present in proper location; pt doing well  Plan: F/u if any signs of infection or can no longer feel the strings.   Carla Marcella B. Jeffory Snelgrove, PA-C 03/15/2023 12:02 PM

## 2023-03-19 ENCOUNTER — Other Ambulatory Visit: Payer: 59

## 2023-03-19 ENCOUNTER — Ambulatory Visit: Payer: 59 | Admitting: Obstetrics and Gynecology

## 2023-03-19 DIAGNOSIS — Z30431 Encounter for routine checking of intrauterine contraceptive device: Secondary | ICD-10-CM

## 2023-03-20 NOTE — Progress Notes (Signed)
   Chief Complaint  Patient presents with   IUD check    No concerns     History of Present Illness:  Carla Johnson is a 48 y.o. that had a Paragard  IUD placed approximately 1 month ago due to retinal vein occlusion (concern due to prog in Mirena), replacing expiring Mirena. Was amenorrheic with Mirena. Had 1 wk of bleeding after Paragard  insertion but no bleeding/BTB since, no dysmen. No dyspareunia/bleeding with sex.  Review of Systems  Constitutional:  Negative for fever.  Gastrointestinal:  Negative for blood in stool, constipation, diarrhea, nausea and vomiting.  Genitourinary:  Negative for dyspareunia, dysuria, flank pain, frequency, hematuria, urgency, vaginal bleeding, vaginal discharge and vaginal pain.  Musculoskeletal:  Negative for back pain.  Skin:  Negative for rash.    Physical Exam:  BP 119/72   Pulse (!) 58   Ht 5' 6 (1.676 m)   Wt 200 lb (90.7 kg)   BMI 32.28 kg/m  Body mass index is 32.28 kg/m.  Pelvic exam:  Two IUD strings present seen coming from the cervical os. EGBUS, vaginal vault and cervix: within normal limits   Assessment:   Encounter for routine checking of intrauterine contraceptive device (IUD)  IUD strings present in proper location; pt doing well  Plan: F/u if any signs of infection or can no longer feel the strings.   Tijana Walder B. Selig [place], PA-C 03/22/2023 9:35 AM

## 2023-03-22 ENCOUNTER — Encounter: Payer: Self-pay | Admitting: Obstetrics and Gynecology

## 2023-03-22 ENCOUNTER — Other Ambulatory Visit: Payer: 59

## 2023-03-22 ENCOUNTER — Ambulatory Visit (INDEPENDENT_AMBULATORY_CARE_PROVIDER_SITE_OTHER): Payer: 59 | Admitting: Obstetrics and Gynecology

## 2023-03-22 VITALS — BP 119/72 | HR 58 | Ht 66.0 in | Wt 200.0 lb

## 2023-03-22 DIAGNOSIS — Z30431 Encounter for routine checking of intrauterine contraceptive device: Secondary | ICD-10-CM | POA: Diagnosis not present

## 2023-03-22 DIAGNOSIS — Z13220 Encounter for screening for lipoid disorders: Secondary | ICD-10-CM

## 2023-03-22 DIAGNOSIS — Z0000 Encounter for general adult medical examination without abnormal findings: Secondary | ICD-10-CM

## 2023-03-22 DIAGNOSIS — Z131 Encounter for screening for diabetes mellitus: Secondary | ICD-10-CM | POA: Diagnosis not present

## 2023-03-23 LAB — CBC WITH DIFFERENTIAL/PLATELET
Basophils Absolute: 0 10*3/uL (ref 0.0–0.2)
Basos: 1 %
EOS (ABSOLUTE): 0.1 10*3/uL (ref 0.0–0.4)
Eos: 2 %
Hematocrit: 43.6 % (ref 34.0–46.6)
Hemoglobin: 14.6 g/dL (ref 11.1–15.9)
Immature Grans (Abs): 0 10*3/uL (ref 0.0–0.1)
Immature Granulocytes: 0 %
Lymphocytes Absolute: 1.7 10*3/uL (ref 0.7–3.1)
Lymphs: 37 %
MCH: 30.9 pg (ref 26.6–33.0)
MCHC: 33.5 g/dL (ref 31.5–35.7)
MCV: 92 fL (ref 79–97)
Monocytes Absolute: 0.4 10*3/uL (ref 0.1–0.9)
Monocytes: 8 %
Neutrophils Absolute: 2.4 10*3/uL (ref 1.4–7.0)
Neutrophils: 52 %
Platelets: 175 10*3/uL (ref 150–450)
RBC: 4.73 x10E6/uL (ref 3.77–5.28)
RDW: 13 % (ref 11.7–15.4)
WBC: 4.5 10*3/uL (ref 3.4–10.8)

## 2023-03-23 LAB — LIPID PANEL
Chol/HDL Ratio: 4.5 {ratio} — ABNORMAL HIGH (ref 0.0–4.4)
Cholesterol, Total: 200 mg/dL — ABNORMAL HIGH (ref 100–199)
HDL: 44 mg/dL (ref >39)
LDL Chol Calc (NIH): 129 mg/dL — ABNORMAL HIGH (ref 0–99)
Triglycerides: 152 mg/dL — ABNORMAL HIGH (ref 0–149)
VLDL Cholesterol Cal: 27 mg/dL (ref 5–40)

## 2023-03-23 LAB — COMPREHENSIVE METABOLIC PANEL
ALT: 67 [IU]/L — ABNORMAL HIGH (ref 0–32)
AST: 39 [IU]/L (ref 0–40)
Albumin: 4.4 g/dL (ref 3.9–4.9)
Alkaline Phosphatase: 80 [IU]/L (ref 44–121)
BUN/Creatinine Ratio: 12 (ref 9–23)
BUN: 8 mg/dL (ref 6–24)
Bilirubin Total: 0.4 mg/dL (ref 0.0–1.2)
CO2: 23 mmol/L (ref 20–29)
Calcium: 8.9 mg/dL (ref 8.7–10.2)
Chloride: 107 mmol/L — ABNORMAL HIGH (ref 96–106)
Creatinine, Ser: 0.67 mg/dL (ref 0.57–1.00)
Globulin, Total: 1.8 g/dL (ref 1.5–4.5)
Glucose: 99 mg/dL (ref 70–99)
Potassium: 4.1 mmol/L (ref 3.5–5.2)
Sodium: 144 mmol/L (ref 134–144)
Total Protein: 6.2 g/dL (ref 6.0–8.5)
eGFR: 110 mL/min/{1.73_m2} (ref >59)

## 2023-03-23 LAB — HEMOGLOBIN A1C
Est. average glucose Bld gHb Est-mCnc: 103 mg/dL
Hgb A1c MFr Bld: 5.2 % (ref 4.8–5.6)

## 2023-07-13 ENCOUNTER — Other Ambulatory Visit: Payer: Self-pay

## 2024-01-14 ENCOUNTER — Other Ambulatory Visit: Payer: Self-pay

## 2024-01-16 ENCOUNTER — Other Ambulatory Visit: Payer: Self-pay

## 2024-01-17 ENCOUNTER — Other Ambulatory Visit: Payer: Self-pay

## 2024-01-18 ENCOUNTER — Other Ambulatory Visit: Payer: Self-pay

## 2024-01-21 ENCOUNTER — Other Ambulatory Visit: Payer: Self-pay

## 2024-01-22 ENCOUNTER — Other Ambulatory Visit: Payer: Self-pay

## 2024-01-23 ENCOUNTER — Other Ambulatory Visit: Payer: Self-pay

## 2024-01-24 ENCOUNTER — Other Ambulatory Visit: Payer: Self-pay
